# Patient Record
Sex: Female | Born: 1960 | Race: Black or African American | Hispanic: No | Smoking: Current every day smoker
Health system: Southern US, Community
[De-identification: ages and names within clinical notes are randomized; demographics above are authoritative.]

## PROBLEM LIST (undated history)

## (undated) DIAGNOSIS — R0609 Other forms of dyspnea: Secondary | ICD-10-CM

## (undated) DIAGNOSIS — R0600 Dyspnea, unspecified: Secondary | ICD-10-CM

## (undated) DIAGNOSIS — E079 Disorder of thyroid, unspecified: Secondary | ICD-10-CM

## (undated) DIAGNOSIS — I10 Essential (primary) hypertension: Secondary | ICD-10-CM

## (undated) HISTORY — DX: Dyspnea, unspecified: R06.00

## (undated) HISTORY — PX: SHOULDER SURGERY: SHX246

## (undated) HISTORY — PX: ABDOMINAL HYSTERECTOMY: SHX81

## (undated) HISTORY — DX: Other forms of dyspnea: R06.09

---

## 1998-12-30 HISTORY — PX: SHOULDER SURGERY: SHX246

## 2004-10-02 ENCOUNTER — Ambulatory Visit: Payer: Self-pay | Admitting: Obstetrics & Gynecology

## 2005-08-19 ENCOUNTER — Ambulatory Visit: Payer: Self-pay | Admitting: Unknown Physician Specialty

## 2006-01-08 ENCOUNTER — Ambulatory Visit: Payer: Self-pay | Admitting: Obstetrics & Gynecology

## 2007-01-22 ENCOUNTER — Ambulatory Visit: Payer: Self-pay | Admitting: Obstetrics & Gynecology

## 2008-01-13 ENCOUNTER — Ambulatory Visit: Payer: Self-pay | Admitting: Obstetrics & Gynecology

## 2010-04-19 ENCOUNTER — Ambulatory Visit: Payer: Self-pay | Admitting: Obstetrics & Gynecology

## 2011-01-11 ENCOUNTER — Emergency Department (HOSPITAL_BASED_OUTPATIENT_CLINIC_OR_DEPARTMENT_OTHER)
Admission: EM | Admit: 2011-01-11 | Discharge: 2011-01-11 | Payer: Self-pay | Source: Home / Self Care | Admitting: Emergency Medicine

## 2011-09-12 ENCOUNTER — Ambulatory Visit: Payer: Self-pay | Admitting: Obstetrics & Gynecology

## 2011-12-02 ENCOUNTER — Emergency Department (INDEPENDENT_AMBULATORY_CARE_PROVIDER_SITE_OTHER): Payer: Federal, State, Local not specified - PPO

## 2011-12-02 ENCOUNTER — Emergency Department (HOSPITAL_BASED_OUTPATIENT_CLINIC_OR_DEPARTMENT_OTHER)
Admission: EM | Admit: 2011-12-02 | Discharge: 2011-12-02 | Disposition: A | Discharge status: Home / Self Care | Payer: Federal, State, Local not specified - PPO | Attending: Emergency Medicine | Admitting: Emergency Medicine

## 2011-12-02 ENCOUNTER — Encounter: Payer: Self-pay | Admitting: *Deleted

## 2011-12-02 DIAGNOSIS — X503XXA Overexertion from repetitive movements, initial encounter: Secondary | ICD-10-CM | POA: Insufficient documentation

## 2011-12-02 DIAGNOSIS — S239XXA Sprain of unspecified parts of thorax, initial encounter: Secondary | ICD-10-CM | POA: Insufficient documentation

## 2011-12-02 DIAGNOSIS — Y9289 Other specified places as the place of occurrence of the external cause: Secondary | ICD-10-CM | POA: Insufficient documentation

## 2011-12-02 DIAGNOSIS — M25519 Pain in unspecified shoulder: Secondary | ICD-10-CM

## 2011-12-02 DIAGNOSIS — E079 Disorder of thyroid, unspecified: Secondary | ICD-10-CM | POA: Insufficient documentation

## 2011-12-02 DIAGNOSIS — F17200 Nicotine dependence, unspecified, uncomplicated: Secondary | ICD-10-CM | POA: Insufficient documentation

## 2011-12-02 DIAGNOSIS — M549 Dorsalgia, unspecified: Secondary | ICD-10-CM

## 2011-12-02 DIAGNOSIS — R079 Chest pain, unspecified: Secondary | ICD-10-CM

## 2011-12-02 HISTORY — DX: Disorder of thyroid, unspecified: E07.9

## 2011-12-02 MED ORDER — CYCLOBENZAPRINE HCL 10 MG PO TABS: 10.0000 mg | ORAL_TABLET | Freq: Two times a day (BID) | ORAL | Status: DC | PRN

## 2011-12-02 NOTE — ED Notes (Signed)
Right shoulder right side of mid to upper back pain onset 10 days ago but isnt getting better got much worse last pm

## 2011-12-02 NOTE — ED Provider Notes (Signed)
History     CSN: [id number] Arrival date & time: 12/02/2011  8:36 AM   First MD Initiated Contact with Patient 12/02/11 1010      Chief Complaint  Patient presents with  . Shoulder Pain    (Consider location/radiation/quality/duration/timing/severity/associated sxs/prior treatment) HPI Pain in right shoulder and right upper back for ten days.  Patient lifts constantly at work.  Pain increases with any movement.  No cough, fever, or dyspnea. Patient has taken ibuprofen with some relief.  PMD none.     Past Medical History  Diagnosis Date  . Thyroid disease     Past Surgical History  Procedure Date  . Abdominal hysterectomy   . Shoulder surgery     left reconstructive     History reviewed. No pertinent family history.  History  Substance Use Topics  . Smoking status: Current Everyday Smoker -- 0.5 packs/day  . Smokeless tobacco: Not on file  . Alcohol Use: Yes     occ    OB History    Grav Para Term Preterm Abortions TAB SAB Ect Mult Living                  Review of Systems  All other systems reviewed and are negative.    Allergies  Review of patient's allergies indicates no known allergies.  Home Medications   Current Outpatient Rx  Name Route Sig Dispense Refill  . LEVOTHYROXINE SODIUM 100 MCG PO TABS Oral Take 100 mcg by mouth daily.        BP 133/88  Pulse 91  Temp(Src) 98.5 F (36.9 C) (Oral)  Resp 18  Ht 5' 7.5" (1.715 m)  Wt 195 lb (88.451 kg)  BMI 30.09 kg/m2  SpO2 98%  Physical Exam  Nursing note and vitals reviewed. Constitutional: She is oriented to person, place, and time. She appears well-developed and well-nourished.  HENT:  Head: Normocephalic and atraumatic.  Cardiovascular: Normal rate, regular rhythm, normal heart sounds and intact distal pulses.   Pulmonary/Chest: Effort normal and breath sounds normal.  Abdominal: Soft. Bowel sounds are normal.  Musculoskeletal: Normal range of motion.       Right paraspinal tenderness   Neurological: She is alert and oriented to person, place, and time. She has normal reflexes.  Skin: Skin is warm and dry.  Psychiatric: She has a normal mood and affect.    ED Course  Procedures (including critical care time)  Labs Reviewed - No data to display Dg Chest 2 View  12/02/2011  *RADIOLOGY REPORT*  Clinical Data: Right shoulder and upper back pain.  CHEST - 2 VIEW  Comparison: None.  Findings: The lungs are clear.  The heart and mediastinal structures are normal.  The osseous structures are unremarkable.  IMPRESSION: No evidence for active chest disease.  Original Report Authenticated By: Rolla Plate, M.D.   Dg Shoulder Right  12/02/2011  *RADIOLOGY REPORT*  Clinical Data: Right shoulder pain.  RIGHT SHOULDER - 2+ VIEW  Comparison: None.  Findings: There are no erosive or destructive changes.  There is no evidence for an occult fracture.  The soft tissues have a normal appearance.  IMPRESSION: Negative study.  Original Report Authenticated By: Rolla Plate, M.D.     No diagnosis found.    MDM       Hilario Quarry, MD 12/02/11 [phone]

## 2011-12-12 ENCOUNTER — Ambulatory Visit (INDEPENDENT_AMBULATORY_CARE_PROVIDER_SITE_OTHER): Payer: Federal, State, Local not specified - PPO | Admitting: Family Medicine

## 2011-12-12 ENCOUNTER — Encounter: Payer: Self-pay | Admitting: Family Medicine

## 2011-12-12 DIAGNOSIS — M7540 Impingement syndrome of unspecified shoulder: Secondary | ICD-10-CM

## 2011-12-12 DIAGNOSIS — S239XXA Sprain of unspecified parts of thorax, initial encounter: Secondary | ICD-10-CM

## 2011-12-12 DIAGNOSIS — S29019A Strain of muscle and tendon of unspecified wall of thorax, initial encounter: Secondary | ICD-10-CM | POA: Insufficient documentation

## 2011-12-12 HISTORY — DX: Impingement syndrome of unspecified shoulder: M75.40

## 2011-12-12 MED ORDER — TRAMADOL HCL 50 MG PO TABS: 50.0000 mg | ORAL_TABLET | Freq: Four times a day (QID) | ORAL | Status: AC | PRN

## 2011-12-12 MED ORDER — CYCLOBENZAPRINE HCL 10 MG PO TABS: 10.0000 mg | ORAL_TABLET | Freq: Three times a day (TID) | ORAL | Status: AC | PRN

## 2011-12-12 MED ORDER — MELOXICAM 15 MG PO TABS: 15.0000 mg | ORAL_TABLET | Freq: Every day | ORAL | Status: DC

## 2011-12-12 NOTE — Assessment & Plan Note (Signed)
Exam mostly normal and states pain is only occasional, not hurting her currently.  Describes rotator cuff impingement.  Start meloxicam and PT for this as well.  Icing as needed, avoid painful activities.  See instructions for further.

## 2011-12-12 NOTE — Patient Instructions (Signed)
You have a thoracic muscle strain of your back. Take tylenol for baseline pain relief (1-2 extra strength tabs 3x/day) Meloxicam daily with food for pain and inflammation. Tramadol as needed for severe pain (no driving on this medicine). Flexeril as needed for muscle spasms (no driving on this medicine if it makes you sleepy). Stay as active as possible. Start physical therapy for this but also do home exercises and stretches as directed. Consider massage, chiropractor, physical therapy, and/or acupuncture. Physical therapy has been shown to be helpful while the others have mixed results. Strengthening of low back muscles, abdominal musculature are key for [place] term pain relief.  You have rotator cuff impingement Try to avoid painful activities (overhead activities, lifting with extended arm) as much as possible. Meloxicam, tylenol, tramadol for pain as noted above. Subacromial injection may be beneficial to help with pain and to decrease inflammation if this becomes constant. Start physical therapy and home exercises for this as well. If not improving at follow-up we will consider ultrasound.  Follow up with me in 1 month.

## 2011-12-12 NOTE — Assessment & Plan Note (Signed)
Thoracic strain - Do not think dedicated thoracic radiographs are warranted at this time.  Start PT, meloxicam with flexeril and tramadol as needed.  Ice or heat as needed.

## 2011-12-12 NOTE — Progress Notes (Signed)
Subjective:    Patient ID: Kelly Frank, female    DOB: [birth year], 50 y.o.   MRN: [id number]  PCP: None listed  HPI 50 yo F here for mid-back and right shoulder pain.  Patient denies known injury. 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 About 2-3 weeks ago developed worsening right thoracic back pain. No numbness or tingling. Went to ED because pain was so severe in back - given flexeril, had normal CXR and right shoulder radiographs. Did wake her at night (back). No radiation into extremities. No bowel/bladder dysfunction.  She is right handed. Gets a sharp occasional pain in right shoulder when in certain positions (mostly lifting and reaching behind). Feels like it's going to pop out of socket at times though has not had a prior injury. Does not wake her at night. Prior left shoulder dislocations and surgery for this.  Past Medical History  Diagnosis Date  . Thyroid disease     Current Outpatient Prescriptions on File Prior to Visit  Medication Sig Dispense Refill  . levothyroxine (SYNTHROID, LEVOTHROID) 100 MCG tablet Take 100 mcg by mouth daily.          Past Surgical History  Procedure Date  . Abdominal hysterectomy   . Shoulder surgery     left reconstructive     No Known Allergies  History   Social History  . Marital Status: Divorced    Spouse Name: N/A    Number of Children: N/A  . Years of Education: N/A   Occupational History  . Not on file.   Social History Main Topics  . Smoking status: Current Everyday Smoker -- 0.5 packs/day  . Smokeless tobacco: Not on file  . Alcohol Use: Yes     occ  . Drug Use: No  . Sexually Active: Yes    Birth Control/ Protection: Surgical   Other Topics Concern  . Not on file   Social History Narrative  . No narrative on file    Family History  Problem Relation Age of Onset  . Diabetes Mother   . Heart attack Neg Hx   . Hyperlipidemia Neg Hx   . Hypertension  Neg Hx   . Sudden death Neg Hx     BP 103/70  Pulse 102  Temp(Src) 97.6 F (36.4 C) (Oral)  Ht 5\' 7"  (1.702 m)  Wt 195 lb (88.451 kg)  BMI 30.54 kg/m2  Review of Systems See HPI above.    Objective:   Physical Exam Gen: NAD  Back: No gross deformity, scoliosis. TTP R thoracic paraspinal region in musculature.  No midline or bony TTP. FROM with pain on extension, rotation. No scapular winging. Sensation intact to light touch bilaterally. Negative logroll bilateral hips  R shoulder: No swelling, ecchymoses.  No gross deformity. Minimal TTP at biceps tendon, lateral shoulder.  No AC TTP. FROM without painful arc. Negative Hawkins, Neers. Negative Speeds, Yergasons. Negative Empty can and resisted internal/external rotation with 5/5 strength. Negative apprehension. NV intact distally.    Assessment & Plan:  1. Thoracic strain - Do not think dedicated thoracic radiographs are warranted at this time.  Start PT, meloxicam with flexeril and tramadol as needed.  Ice or heat as needed.    2. Right shoulder pain - Exam mostly normal and states pain is only occasional, not hurting her currently.  Describes rotator cuff impingement.  Start meloxicam and PT for this as well.  Icing as needed, avoid  painful activities.  See instructions for further.

## 2011-12-17 ENCOUNTER — Ambulatory Visit: Payer: Federal, State, Local not specified - PPO | Attending: Family Medicine | Admitting: Physical Therapy

## 2011-12-17 DIAGNOSIS — M546 Pain in thoracic spine: Secondary | ICD-10-CM | POA: Insufficient documentation

## 2011-12-17 DIAGNOSIS — M25519 Pain in unspecified shoulder: Secondary | ICD-10-CM | POA: Insufficient documentation

## 2011-12-17 DIAGNOSIS — IMO0001 Reserved for inherently not codable concepts without codable children: Secondary | ICD-10-CM | POA: Insufficient documentation

## 2012-01-02 ENCOUNTER — Encounter: Payer: Federal, State, Local not specified - PPO | Admitting: Physical Therapy

## 2012-01-06 ENCOUNTER — Ambulatory Visit: Payer: Federal, State, Local not specified - PPO | Attending: Family Medicine | Admitting: Physical Therapy

## 2012-01-06 DIAGNOSIS — IMO0001 Reserved for inherently not codable concepts without codable children: Secondary | ICD-10-CM | POA: Insufficient documentation

## 2012-01-06 DIAGNOSIS — M546 Pain in thoracic spine: Secondary | ICD-10-CM | POA: Insufficient documentation

## 2012-01-06 DIAGNOSIS — M25519 Pain in unspecified shoulder: Secondary | ICD-10-CM | POA: Insufficient documentation

## 2012-01-08 ENCOUNTER — Ambulatory Visit: Payer: Federal, State, Local not specified - PPO | Admitting: Physical Therapy

## 2012-01-13 ENCOUNTER — Ambulatory Visit: Payer: Federal, State, Local not specified - PPO | Admitting: Physical Therapy

## 2012-01-27 ENCOUNTER — Ambulatory Visit: Payer: Federal, State, Local not specified - PPO | Admitting: Physical Therapy

## 2012-02-05 ENCOUNTER — Other Ambulatory Visit: Payer: Self-pay | Admitting: Family Medicine

## 2013-10-22 ENCOUNTER — Encounter: Payer: Self-pay | Admitting: *Deleted

## 2013-11-09 ENCOUNTER — Ambulatory Visit (INDEPENDENT_AMBULATORY_CARE_PROVIDER_SITE_OTHER): Payer: BC Managed Care – PPO | Admitting: General Surgery

## 2013-11-09 ENCOUNTER — Encounter: Payer: Self-pay | Admitting: General Surgery

## 2013-11-09 VITALS — BP 124/66 | HR 78 | Resp 12 | Ht 67.0 in | Wt 203.0 lb

## 2013-11-09 DIAGNOSIS — Z1211 Encounter for screening for malignant neoplasm of colon: Secondary | ICD-10-CM | POA: Insufficient documentation

## 2013-11-09 MED ORDER — POLYETHYLENE GLYCOL 3350 17 GM/SCOOP PO POWD: 1.0000 | Freq: Once | ORAL | Status: DC

## 2013-11-09 NOTE — Patient Instructions (Addendum)
The patient is aware to call back for any questions or concerns. Colonoscopy A colonoscopy is an exam to evaluate your entire colon. In this exam, your colon is cleansed. A Thorup fiberoptic tube is inserted through your rectum and into your colon. The fiberoptic scope (endoscope) is a Beckner bundle of enclosed and very flexible fibers. These fibers transmit light to the area examined and send images from that area to your caregiver. Discomfort is usually minimal. You may be given a drug to help you sleep (sedative) during or prior to the procedure. This exam helps to detect lumps (tumors), polyps, inflammation, and areas of bleeding. Your caregiver may also take a small piece of tissue (biopsy) that will be examined under a microscope. LET YOUR CAREGIVER KNOW ABOUT:   Allergies to food or medicine.  Medicines taken, including vitamins, herbs, eyedrops, over-the-counter medicines, and creams.  Use of steroids (by mouth or creams).  Previous problems with anesthetics or numbing medicines.  History of bleeding problems or blood clots.  Previous surgery.  Other health problems, including diabetes and kidney problems.  Possibility of pregnancy, if this applies. BEFORE THE PROCEDURE   A clear liquid diet may be required for 2 days before the exam.  Ask your caregiver about changing or stopping your regular medications.  Liquid injections (enemas) or laxatives may be required.  A large amount of electrolyte solution may be given to you to drink over a short period of time. This solution is used to clean out your colon.  You should be present 60 minutes prior to your procedure or as directed by your caregiver. AFTER THE PROCEDURE   If you received a sedative or pain relieving medication, you will need to arrange for someone to drive you home.  Occasionally, there is a little blood passed with the first bowel movement. Do not be concerned. FINDING OUT THE RESULTS OF YOUR TEST Not all test  results are available during your visit. If your test results are not back during the visit, make an appointment with your caregiver to find out the results. Do not assume everything is normal if you have not heard from your caregiver or the medical facility. It is important for you to follow up on all of your test results. HOME CARE INSTRUCTIONS   It is not unusual to pass moderate amounts of gas and experience mild abdominal cramping following the procedure. This is due to air being used to inflate your colon during the exam. Walking or a warm pack on your belly (abdomen) may help.  You may resume all normal meals and activities after sedatives and medicines have worn off.  Only take over-the-counter or prescription medicines for pain, discomfort, or fever as directed by your caregiver. Do not use aspirin or blood thinners if a biopsy was taken. Consult your caregiver for medicine usage if biopsies were taken. SEEK IMMEDIATE MEDICAL CARE IF:   You have a fever.  You pass large blood clots or fill a toilet with blood following the procedure. This may also occur 10 to 14 days following the procedure. This is more likely if a biopsy was taken.  You develop abdominal pain that keeps getting worse and cannot be relieved with medicine. Document Released: 12/13/2000 Document Revised: 03/09/2012 Document Reviewed: 07/19/2013 [facility] Patient Information 2014 [place], [place].   Patient is scheduled for a colonoscopy at [facility] on 12/14/13, date and instructions provided to the patient. Mira lax prescription E-Prescribed. patient aware to call us with any questions or changes  in medications or health status.

## 2013-11-09 NOTE — Progress Notes (Signed)
Patient ID: Kelly Frank, female   DOB: 09/29/1961, 52 y.o.   MRN: [id number]  Chief Complaint  Patient presents with  . Other    colonoscpy    HPI Kelly Frank is a 52 y.o. female.  Here today for evaluation of a screening colonoscopy referred by Dr. Velora Mediate. She has never had a colonoscopy.  She denies any gastrointestinal issues. She states her bowels normally move about 3 times a day. Denies any colon cancer or polyps in the family. HPI  Past Medical History  Diagnosis Date  . Thyroid disease     Past Surgical History  Procedure Laterality Date  . Abdominal hysterectomy    . Shoulder surgery  2000    left reconstructive     Family History  Problem Relation Age of Onset  . Diabetes Mother   . Heart attack Neg Hx   . Hyperlipidemia Neg Hx   . Hypertension Neg Hx   . Sudden death Neg Hx     Social History History  Substance Use Topics  . Smoking status: Current Every Day Smoker -- 0.50 packs/day for 25 years  . Smokeless tobacco: Not on file  . Alcohol Use: Yes     Comment: occasionally    No Known Allergies  Current Outpatient Prescriptions  Medication Sig Dispense Refill  . levothyroxine (SYNTHROID, LEVOTHROID) 100 MCG tablet Take 100 mcg by mouth daily.        . meloxicam (MOBIC) 15 MG tablet TAKE 1 TABLET BY MOUTH ONCE DAILY WITH FOOD  30 tablet  1  . polyethylene glycol powder (GLYCOLAX/MIRALAX) powder Take 255 g (1 Container total) by mouth once.  255 g  0   No current facility-administered medications for this visit.    Review of Systems Review of Systems  Constitutional: Negative.   Respiratory: Negative.   Cardiovascular: Negative.   Gastrointestinal: Negative.     Blood pressure 124/66, pulse 78, resp. rate 12, height 5\' 7"  (1.702 m), weight 203 lb (92.08 kg).  Physical Exam Physical Exam  Constitutional: She is oriented to person, place, and time. She appears well-developed and well-nourished.  Cardiovascular: Normal rate, regular rhythm  and normal heart sounds.   Pulmonary/Chest: Effort normal and breath sounds normal.  Neurological: She is alert and oriented to person, place, and time.  Skin: Skin is warm and dry.    Data Reviewed PCP Notes.  Assessment    Candidate for screening colonoscopy.     Plan    Colonoscopy with possible biopsy/polypectomy prn: Information regarding the procedure, including its potential risks and complications (including but not limited to perforation of the bowel, which may require emergency surgery to repair, and bleeding) was verbally given to the patient. Educational information regarding lower instestinal endoscopy was given to the patient. Written instructions for how to complete the bowel prep using Miralax were provided. The importance of drinking ample fluids to avoid dehydration as a result of the prep emphasized.    Patient is scheduled for a colonoscopy at [facility] on 12/14/13, date and instructions provided to the patient. Mira lax prescription E-Prescribed. patient aware to call us with any questions or changes in medications or health status.    Kelly Frank 11/09/2013, 8:21 PM

## 2013-12-08 ENCOUNTER — Telehealth: Payer: Self-pay | Admitting: *Deleted

## 2013-12-08 NOTE — Telephone Encounter (Signed)
Patient confirms no medication changes since last office visit. This patient also reports she has not pre-registered and was instructed to do that by Thursday, 12-09-13. She reports she already has Miralax prescription. Patient instructed to call the office if she has further questions.

## 2013-12-09 ENCOUNTER — Other Ambulatory Visit: Payer: Self-pay | Admitting: General Surgery

## 2013-12-09 DIAGNOSIS — Z1211 Encounter for screening for malignant neoplasm of colon: Secondary | ICD-10-CM

## 2013-12-14 ENCOUNTER — Ambulatory Visit: Payer: Self-pay | Admitting: General Surgery

## 2013-12-14 DIAGNOSIS — Z1211 Encounter for screening for malignant neoplasm of colon: Secondary | ICD-10-CM

## 2013-12-16 ENCOUNTER — Encounter: Payer: Self-pay | Admitting: General Surgery

## 2014-10-31 ENCOUNTER — Encounter: Payer: Self-pay | Admitting: General Surgery

## 2016-05-14 ENCOUNTER — Other Ambulatory Visit: Payer: Self-pay | Admitting: Obstetrics & Gynecology

## 2016-05-14 DIAGNOSIS — Z1231 Encounter for screening mammogram for malignant neoplasm of breast: Secondary | ICD-10-CM

## 2017-03-26 ENCOUNTER — Telehealth: Payer: Self-pay | Admitting: Internal Medicine

## 2017-03-26 NOTE — Telephone Encounter (Signed)
[facility] for appointment on 04/03/17 with Dr Rennis GoldenHilty.  Records put with Dr Blanchie DessertHilty's schedule for 04/03/17. lp

## 2017-04-03 ENCOUNTER — Ambulatory Visit: Payer: Federal, State, Local not specified - PPO | Admitting: Internal Medicine

## 2017-04-18 ENCOUNTER — Encounter (HOSPITAL_BASED_OUTPATIENT_CLINIC_OR_DEPARTMENT_OTHER): Payer: Self-pay | Admitting: *Deleted

## 2017-04-18 DIAGNOSIS — F17200 Nicotine dependence, unspecified, uncomplicated: Secondary | ICD-10-CM | POA: Diagnosis not present

## 2017-04-18 DIAGNOSIS — I10 Essential (primary) hypertension: Secondary | ICD-10-CM | POA: Insufficient documentation

## 2017-04-18 DIAGNOSIS — Z79899 Other long term (current) drug therapy: Secondary | ICD-10-CM | POA: Diagnosis not present

## 2017-04-18 DIAGNOSIS — R1013 Epigastric pain: Secondary | ICD-10-CM | POA: Diagnosis not present

## 2017-04-18 NOTE — ED Triage Notes (Signed)
Pt with periumbilical pain x one week. Denies N/V D or fevers. Was seen by PCP on Monday was given a medication but is not certain what medication is , may be for gas or acid

## 2017-04-19 ENCOUNTER — Emergency Department (HOSPITAL_BASED_OUTPATIENT_CLINIC_OR_DEPARTMENT_OTHER)
Admission: EM | Admit: 2017-04-19 | Discharge: 2017-04-19 | Disposition: A | Discharge status: Home / Self Care | Payer: Federal, State, Local not specified - PPO | Attending: Emergency Medicine | Admitting: Emergency Medicine

## 2017-04-19 DIAGNOSIS — R1013 Epigastric pain: Secondary | ICD-10-CM

## 2017-04-19 HISTORY — DX: Essential (primary) hypertension: I10

## 2017-04-19 LAB — COMPREHENSIVE METABOLIC PANEL
ALBUMIN: 3.1 g/dL — AB (ref 3.5–5.0)
ALK PHOS: 91 U/L (ref 38–126)
ALT: 27 U/L (ref 14–54)
AST: 28 U/L (ref 15–41)
Anion gap: 9 (ref 5–15)
BUN: 15 mg/dL (ref 6–20)
CALCIUM: 8.7 mg/dL — AB (ref 8.9–10.3)
CO2: 28 mmol/L (ref 22–32)
Chloride: 102 mmol/L (ref 101–111)
Creatinine, Ser: 1.26 mg/dL — ABNORMAL HIGH (ref 0.44–1.00)
GFR calc Af Amer: 55 mL/min — ABNORMAL LOW (ref >60)
GFR calc non Af Amer: 47 mL/min — ABNORMAL LOW (ref >60)
GLUCOSE: 96 mg/dL (ref 65–99)
Potassium: 3.9 mmol/L (ref 3.5–5.1)
Sodium: 139 mmol/L (ref 135–145)
TOTAL PROTEIN: 6 g/dL — AB (ref 6.5–8.1)
Total Bilirubin: 1.3 mg/dL — ABNORMAL HIGH (ref 0.3–1.2)

## 2017-04-19 LAB — URINALYSIS, ROUTINE W REFLEX MICROSCOPIC
Glucose, UA: NEGATIVE mg/dL
Hgb urine dipstick: NEGATIVE
Ketones, ur: 15 mg/dL — AB
NITRITE: NEGATIVE
PH: 5.5 (ref 5.0–8.0)
PROTEIN: 30 mg/dL — AB
Specific Gravity, Urine: 1.028 (ref 1.005–1.030)

## 2017-04-19 LAB — CBC WITH DIFFERENTIAL/PLATELET
BASOS ABS: 0 10*3/uL (ref 0.0–0.1)
BASOS PCT: 0 %
Eosinophils Absolute: 0.2 10*3/uL (ref 0.0–0.7)
Eosinophils Relative: 4 %
HEMATOCRIT: 45.3 % (ref 36.0–46.0)
HEMOGLOBIN: 15.3 g/dL — AB (ref 12.0–15.0)
Lymphocytes Relative: 29 %
Lymphs Abs: 1.9 10*3/uL (ref 0.7–4.0)
MCH: 28.9 pg (ref 26.0–34.0)
MCHC: 33.8 g/dL (ref 30.0–36.0)
MCV: 85.5 fL (ref 78.0–100.0)
MONOS PCT: 10 %
Monocytes Absolute: 0.7 10*3/uL (ref 0.1–1.0)
NEUTROS ABS: 3.6 10*3/uL (ref 1.7–7.7)
NEUTROS PCT: 57 %
Platelets: 204 10*3/uL (ref 150–400)
RBC: 5.3 MIL/uL — AB (ref 3.87–5.11)
RDW: 15.7 % — ABNORMAL HIGH (ref 11.5–15.5)
WBC: 6.3 10*3/uL (ref 4.0–10.5)

## 2017-04-19 LAB — URINALYSIS, MICROSCOPIC (REFLEX)

## 2017-04-19 LAB — LIPASE, BLOOD: Lipase: 61 U/L — ABNORMAL HIGH (ref 11–51)

## 2017-04-19 MED ORDER — SUCRALFATE 1 G PO TABS: 1.0000 g | ORAL_TABLET | Freq: Three times a day (TID) | ORAL | 0 refills | Status: DC

## 2017-04-19 MED ORDER — SUCRALFATE 1 G PO TABS
1.0000 g | ORAL_TABLET | Freq: Once | ORAL | Status: AC
  Administered 2017-04-19: 1 g via ORAL
  Filled 2017-04-19: qty 1

## 2017-04-19 NOTE — ED Notes (Signed)
Pt given cup to attempt urine sample. 

## 2017-04-19 NOTE — ED Provider Notes (Signed)
[facility] Provider Note: Lowella Dell, MD, FACEP  CSN: [id number] MRN: [id number] ARRIVAL: 04/18/17 at 2334 ROOM: MH06/MH06   CHIEF COMPLAINT  Abdominal Pain   HISTORY OF PRESENT ILLNESS  Kelly Frank is a 56 y.o. female with a one-week history of epigastric pain. She describes the pain as a tightness as if she had pulled a muscle, but the pain is deep and not in the abdominal wall. She describes the pain is intermittent and worse when she ambulates. It does not change with eating or drinking. She rates it as a 7 out of 10 at its worst. She saw her PCP 5 days ago and was placed on "some little pill" that has not provided any relief. Her pain in fact has worsened since then. She denies associated chest pain, shortness of breath, nausea, vomiting, diarrhea or dysuria.   Past Medical History:  Diagnosis Date  . DOE (dyspnea on exertion)   . Hypertension   . Thyroid disease     Past Surgical History:  Procedure Laterality Date  . ABDOMINAL HYSTERECTOMY    . SHOULDER SURGERY  2000   left reconstructive   . SHOULDER SURGERY Left     Family History  Problem Relation Age of Onset  . Diabetes Mother   . Hypertension Mother   . Heart attack Neg Hx   . Hyperlipidemia Neg Hx   . Sudden death Neg Hx     Social History  Substance Use Topics  . Smoking status: Current Every Day Smoker    Packs/day: 0.50    Years: 25.00  . Smokeless tobacco: Never Used  . Alcohol use Yes     Comment: occasionally    Prior to Admission medications   Medication Sig Start Date End Date Taking? Authorizing Provider  baclofen (LIORESAL) 10 MG tablet Take 10 mg by mouth 3 (three) times daily.    Historical Provider, MD  carvedilol (COREG) 3.125 MG tablet Take 1 tablet by mouth 2 (two) times daily. 03/12/17   Historical Provider, MD  furosemide (LASIX) 20 MG tablet Take 20 mg by mouth daily. 03/12/17   Historical Provider, MD  levothyroxine (SYNTHROID, LEVOTHROID) 200 MCG tablet Take  200 mcg by mouth daily. 03/20/17   Historical Provider, MD  lisinopril (PRINIVIL,ZESTRIL) 10 MG tablet Take 10 mg by mouth daily. 03/12/17   Historical Provider, MD    Allergies Patient has no known allergies.   REVIEW OF SYSTEMS  Negative except as noted here or in the History of Present Illness.   PHYSICAL EXAMINATION  Initial Vital Signs Blood pressure (!) 132/99, pulse 88, temperature 98.3 F (36.8 C), temperature source Oral, resp. rate 14, height  (1.702 m), weight 174 lb (78.9 kg), SpO2 97 %.  Examination General: Well-developed, well-nourished female in no acute distress; appearance consistent with age of record HENT: normocephalic; atraumatic Eyes: pupils equal, round and reactive to light; extraocular muscles intact Neck: supple Heart: regular rate and rhythm Lungs: clear to auscultation bilaterally Abdomen: soft; nondistended; epigastric tenderness; no masses or hepatosplenomegaly; bowel sounds present Extremities: No deformity; full range of motion; pulses normal Neurologic: Awake, alert and oriented; motor function intact in all extremities and symmetric; no facial droop Skin: Warm and dry Psychiatric: Normal mood and affect   RESULTS  Summary of this visit's results, reviewed by myself:   EKG Interpretation  Date/Time:    Ventricular Rate:    PR Interval:    QRS Duration:   QT Interval:    QTC  Calculation:   R Axis:     Text Interpretation:        Laboratory Studies: Results for orders placed or performed during the hospital encounter of 04/19/17 (from the past 24 hour(s))  Urinalysis, Routine w reflex microscopic     Status: Abnormal   Collection Time: 04/19/17  2:01 AM  Result Value Ref Range   Color, Urine AMBER (A) YELLOW   APPearance CLEAR CLEAR   Specific Gravity, Urine 1.028 1.005 - 1.030   pH 5.5 5.0 - 8.0   Glucose, UA NEGATIVE NEGATIVE mg/dL   Hgb urine dipstick NEGATIVE NEGATIVE   Bilirubin Urine SMALL (A) NEGATIVE   Ketones, ur  15 (A) NEGATIVE mg/dL   Protein, ur 30 (A) NEGATIVE mg/dL   Nitrite NEGATIVE NEGATIVE   Leukocytes, UA SMALL (A) NEGATIVE  Urinalysis, Microscopic (reflex)     Status: Abnormal   Collection Time: 04/19/17  2:01 AM  Result Value Ref Range   RBC / HPF 0-5 0 - 5 RBC/hpf   WBC, UA 6-30 0 - 5 WBC/hpf   Bacteria, UA MANY (A) NONE SEEN   Squamous Epithelial / LPF 0-5 (A) NONE SEEN   Mucous PRESENT   CBC with Differential     Status: Abnormal   Collection Time: 04/19/17  2:15 AM  Result Value Ref Range   WBC 6.3 4.0 - 10.5 K/uL   RBC 5.30 (H) 3.87 - 5.11 MIL/uL   Hemoglobin 15.3 (H) 12.0 - 15.0 g/dL   HCT 16.1 09.6 - 04.5 %   MCV 85.5 78.0 - 100.0 fL   MCH 28.9 26.0 - 34.0 pg   MCHC 33.8 30.0 - 36.0 g/dL   RDW 40.9 (H) 81.1 - 91.4 %   Platelets 204 150 - 400 K/uL   Neutrophils Relative % 57 %   Neutro Abs 3.6 1.7 - 7.7 K/uL   Lymphocytes Relative 29 %   Lymphs Abs 1.9 0.7 - 4.0 K/uL   Monocytes Relative 10 %   Monocytes Absolute 0.7 0.1 - 1.0 K/uL   Eosinophils Relative 4 %   Eosinophils Absolute 0.2 0.0 - 0.7 K/uL   Basophils Relative 0 %   Basophils Absolute 0.0 0.0 - 0.1 K/uL  Comprehensive metabolic panel     Status: Abnormal   Collection Time: 04/19/17  2:15 AM  Result Value Ref Range   Sodium 139 135 - 145 mmol/L   Potassium 3.9 3.5 - 5.1 mmol/L   Chloride 102 101 - 111 mmol/L   CO2 28 22 - 32 mmol/L   Glucose, Bld 96 65 - 99 mg/dL   BUN 15 6 - 20 mg/dL   Creatinine, Ser 7.82 (H) 0.44 - 1.00 mg/dL   Calcium 8.7 (L) 8.9 - 10.3 mg/dL   Total Protein 6.0 (L) 6.5 - 8.1 g/dL   Albumin 3.1 (L) 3.5 - 5.0 g/dL   AST 28 15 - 41 U/L   ALT 27 14 - 54 U/L   Alkaline Phosphatase 91 38 - 126 U/L   Total Bilirubin 1.3 (H) 0.3 - 1.2 mg/dL   GFR calc non Af Amer 47 (L) >60 mL/min   GFR calc Af Amer 55 (L) >60 mL/min   Anion gap 9 5 - 15  Lipase, blood     Status: Abnormal   Collection Time: 04/19/17  2:15 AM  Result Value Ref Range   Lipase 61 (H) 11 - 51 U/L   Imaging  Studies: No results found.  ED COURSE  Nursing notes and initial  vitals signs, including pulse oximetry, reviewed.  Vitals:   04/18/17 2348 04/18/17 2349 04/19/17 0205  BP:  (!) 112/93 (!) 132/99  Pulse: 92  88  Resp: 16  14  Temp: 98.3 F (36.8 C)    TempSrc: Oral    SpO2: 100%  97%  Weight: 174 lb (78.9 kg)    Height:  (1.702 m)     4:12 AM Improvement after oral Carafate. Patient advised slightly elevated lipase but I do not believe a CT scan is indicated at this time. We will treat with Carafate and have her follow-up with her PCP should symptoms worsen or persist.  PROCEDURES    ED DIAGNOSES     ICD-9-CM ICD-10-CM   1. Epigastric pain 789.06 R10.13        Paula Libra, MD 04/19/17 [phone]

## 2017-04-29 ENCOUNTER — Encounter: Payer: Self-pay | Admitting: Internal Medicine

## 2017-04-29 ENCOUNTER — Ambulatory Visit (INDEPENDENT_AMBULATORY_CARE_PROVIDER_SITE_OTHER): Payer: Federal, State, Local not specified - PPO | Admitting: Internal Medicine

## 2017-04-29 VITALS — BP 119/88 | HR 92 | Ht 67.0 in | Wt 172.0 lb

## 2017-04-29 DIAGNOSIS — I10 Essential (primary) hypertension: Secondary | ICD-10-CM | POA: Diagnosis not present

## 2017-04-29 DIAGNOSIS — I5043 Acute on chronic combined systolic (congestive) and diastolic (congestive) heart failure: Secondary | ICD-10-CM

## 2017-04-29 DIAGNOSIS — Z720 Tobacco use: Secondary | ICD-10-CM | POA: Diagnosis not present

## 2017-04-29 HISTORY — DX: Tobacco use: Z72.0

## 2017-04-29 HISTORY — DX: Essential (primary) hypertension: I10

## 2017-04-29 NOTE — Patient Instructions (Signed)
We will request records from [facility].  Dr. Rennis Golden will review these records and we will call you with his plan based on the info received

## 2017-04-29 NOTE — Progress Notes (Signed)
OFFICE CONSULT NOTE  Chief Complaint:  Shortness of breath, fatigue  Primary Care Physician: [facility]  HPI:  Kelly Frank is a 56 y.o. female who is being seen today for the evaluation of new onset heart failure at the request of Lollie Marrow, MD. [name] was recently seen at [facility] for shortness of breath in March 2018. She described it as fairly sudden onset after respiratory infection. She says her symptoms worsened despite recovering from a respiratory infection and did include some fever, leg pain and swelling. She is a smoker with past medical history significant for dyslipidemia and family history of heart failure in her mother however at older age of onset. Today she reports shortness of breath when walking several blocks. She says she tried to mow her lawn and was only able to make about 8 passes before she became short of breath. She denies any chest pain. According to records from [facility] she had an echocardiogram in the office. I personally reviewed the records but was not provided the images to review of her echo, however records suggest the EF was 20% with marked 4 chamber dilatation, moderately depressed RV systolic function, moderate MR and TR and moderate to severe pulmonary hypertension. In addition troponin was ordered and was abnormal and a stress test was performed as well. We will try to obtain those results. She was started on carvedilol, it ACE inhibitor, aspirin and Lasix.  PMHx:  Past Medical History:  Diagnosis Date  . DOE (dyspnea on exertion)   . Hypertension   . Thyroid disease     Past Surgical History:  Procedure Laterality Date  . ABDOMINAL HYSTERECTOMY    . SHOULDER SURGERY  2000   left reconstructive   . SHOULDER SURGERY Left     FAMHx:  Family History  Problem Relation Age of Onset  . Diabetes Mother   . Hypertension Mother   . Heart attack Neg Hx   . Hyperlipidemia Neg Hx   . Sudden death Neg  Hx     SOCHx:   reports that she has been smoking.  She has a 12.50 pack-year smoking history. She has never used smokeless tobacco. She reports that she drinks alcohol. She reports that she does not use drugs.  ALLERGIES:  No Known Allergies  ROS: Pertinent items noted in HPI and remainder of comprehensive ROS otherwise negative.  HOME MEDS: Current Outpatient Prescriptions on File Prior to Visit  Medication Sig Dispense Refill  . baclofen (LIORESAL) 10 MG tablet Take 10 mg by mouth 3 (three) times daily.    . carvedilol (COREG) 3.125 MG tablet Take 1 tablet by mouth 2 (two) times daily.  3  . furosemide (LASIX) 20 MG tablet Take 20 mg by mouth daily.  0  . levothyroxine (SYNTHROID, LEVOTHROID) 200 MCG tablet Take 200 mcg by mouth daily.  2  . lisinopril (PRINIVIL,ZESTRIL) 10 MG tablet Take 10 mg by mouth daily.  3  . sucralfate (CARAFATE) 1 g tablet Take 1 tablet (1 g total) by mouth 4 (four) times daily -  with meals and at bedtime. 40 tablet 0   No current facility-administered medications on file prior to visit.     LABS/IMAGING: No results found for this or any previous visit (from the past 48 hour(s)). No results found.  LIPID PANEL: No results found for: CHOL, TRIG, HDL, CHOLHDL, VLDL, LDLCALC, LDLDIRECT  WEIGHTS: Wt Readings from Last 3 Encounters:  04/29/17 172 lb (78  kg)  04/18/17 174 lb (78.9 kg)  11/09/13 203 lb (92.1 kg)    VITALS: BP 119/88   Pulse 92   Ht  (1.702 m)   Wt 172 lb (78 kg)   BMI 26.94 kg/m   EXAM: General appearance: alert and no distress Neck: no carotid bruit and no JVD Lungs: clear to auscultation bilaterally Heart: regular rate and rhythm, S1, S2 normal, no S3 or S4 and systolic murmur: early systolic 2/6, blowing at apex Abdomen: soft, non-tender; bowel sounds normal; no masses,  no organomegaly Extremities: extremities normal, atraumatic, no cyanosis or edema Pulses: 2+ and symmetric Skin: Skin color, texture, turgor  normal. No rashes or lesions Neurologic: Grossly normal Psych: Pleasant  EKG: Normal sinus rhythm at 92, T-wave abnormalities laterally concerning for possible ischemia - personally reviewed  ASSESSMENT: 1. 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 Dyspnea on exertion-NYHA class III symptoms 3. Tobacco abuse 4. Hypertension  PLAN: 1.   Mrs. Debois had recent upper respiratory infection which may have preceded the development of worsening shortness of breath and was found to have new dilated cardiopathy with EF 20%. She apparently underwent nuclear stress testing at [facility] and those results are not available for me to review. We'll request the images of the echocardiogram and nuclear stress test for my personal review. Based on these findings I'm likely to recommend left and right heart catheterization to exclude any coronary ischemia as a cause of her symptoms although it sounds like she may very well have a nonischemic cardiomyopathy. She is ready on appropriate medications and feels much better after having some diuresis. She will need to continue to work on smoking cessation. We'll contact her when I have had an opportunity to review her studies and make a plan going forward.  Thanks for the kind referral.  Chrystie Nose, MD, [facility]  [place]  [facility]  Attending Cardiologist  Direct Dial: [phone]  Fax: [phone]  Website:  [email or web address] 04/29/2017, 5:52 PM

## 2017-04-30 ENCOUNTER — Telehealth: Payer: Self-pay | Admitting: Internal Medicine

## 2017-04-30 NOTE — Telephone Encounter (Signed)
Faxed Release signed by patient to [facility] to obtain records per Dr Rennis Golden.  lp

## 2017-05-07 ENCOUNTER — Telehealth: Payer: Self-pay | Admitting: Internal Medicine

## 2017-05-07 NOTE — Telephone Encounter (Signed)
[facility] as requested per Dr Rennis GoldenHilty.  Records given to Dr Rennis GoldenHilty for review. lp

## 2017-05-13 ENCOUNTER — Telehealth: Payer: Self-pay | Admitting: Internal Medicine

## 2017-05-13 NOTE — Telephone Encounter (Signed)
Patient returned call. 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 Advised that she should make an appt with Dr. Rennis GoldenHilty. She is unavailable next week. Offered appt this Thursday but patient said a June appt is fine. Was attempting to schedule appointment when [facility] [place]. Patient states is OK to call her back to schedule.

## 2017-05-13 NOTE — Telephone Encounter (Signed)
Contacted patient - scheduled 6/25 @ 9:40am

## 2017-05-13 NOTE — Telephone Encounter (Signed)
-----   Message from Chrystie NoseKenneth C Hilty, MD sent at 05/12/2017  8:22 AM EDT ----- Regarding: follow-up Reviewed her stress test - no ischemia, EF 19%. EF on echo is 20%. Suspect dilated cardiomyopathy. Will need follow-up appointment with me (none scheduled).  Dr. HRexene Edison

## 2017-05-13 NOTE — Telephone Encounter (Signed)
LM with lady to have patient return call

## 2017-06-23 ENCOUNTER — Encounter: Payer: Self-pay | Admitting: Internal Medicine

## 2017-06-23 ENCOUNTER — Ambulatory Visit (INDEPENDENT_AMBULATORY_CARE_PROVIDER_SITE_OTHER): Payer: Federal, State, Local not specified - PPO | Admitting: Internal Medicine

## 2017-06-23 VITALS — BP 118/78 | HR 95 | Ht 67.0 in | Wt 164.0 lb

## 2017-06-23 DIAGNOSIS — G4733 Obstructive sleep apnea (adult) (pediatric): Secondary | ICD-10-CM

## 2017-06-23 DIAGNOSIS — I5043 Acute on chronic combined systolic (congestive) and diastolic (congestive) heart failure: Secondary | ICD-10-CM | POA: Diagnosis not present

## 2017-06-23 DIAGNOSIS — I10 Essential (primary) hypertension: Secondary | ICD-10-CM | POA: Diagnosis not present

## 2017-06-23 DIAGNOSIS — Z720 Tobacco use: Secondary | ICD-10-CM

## 2017-06-23 DIAGNOSIS — I420 Dilated cardiomyopathy: Secondary | ICD-10-CM

## 2017-06-23 DIAGNOSIS — Z9989 Dependence on other enabling machines and devices: Secondary | ICD-10-CM

## 2017-06-23 HISTORY — DX: Obstructive sleep apnea (adult) (pediatric): G47.33

## 2017-06-23 HISTORY — DX: Dilated cardiomyopathy: I42.0

## 2017-06-23 NOTE — Patient Instructions (Signed)
Your physician recommends that you schedule a follow-up appointment as needed  

## 2017-06-23 NOTE — Progress Notes (Signed)
OFFICE CONSULT NOTE  Chief Complaint:  Follow-up CHF  Primary Care Physician: [facility]  HPI:  Kelly Frank is a 56 y.o. female who is being seen today for the evaluation of new onset heart failure at the request of Lollie Marrow, MD. Kelly Frank was recently seen at [facility] for shortness of breath in March 2018. She described it as fairly sudden onset after respiratory infection. She says her symptoms worsened despite recovering from a respiratory infection and did include some fever, leg pain and swelling. She is a smoker with past medical history significant for dyslipidemia and family history of heart failure in her mother however at older age of onset. Today she reports shortness of breath when walking several blocks. She says she tried to mow her lawn and was only able to make about 8 passes before she became short of breath. She denies any chest pain. According to records from [facility] she had an echocardiogram in the office. I personally reviewed the records but was not provided the images to review of her echo, however records suggest the EF was 20% with marked 4 chamber dilatation, moderately depressed RV systolic function, moderate MR and TR and moderate to severe pulmonary hypertension. In addition troponin was ordered and was abnormal and a stress test was performed as well. We will try to obtain those results. She was started on carvedilol, it ACE inhibitor, aspirin and Lasix.  06/23/2017  Kelly Frank returns today for follow-up. I did receive records from her cardiologist at [facility] which indicates she had a stress test which was negative for ischemia and showed a dilated cardiomyopathy. This is highly suggestive of nonischemic cardio myopathy. She has no anginal symptoms. Other than smoking and age which her biggest risk factors for coronary disease, I suspect that this is nonischemic. She recently was taken off of her lisinopril  and placed on Entresto 24/26 mg daily. Her dose of carvedilol was increased to 6.25 mg daily on at [facility]. We previously discussed possibility of heart catheterization however she seems to be improving. She says she recently could only mow about 5 links of her yard and now she can complete the whole yard push mowing without any problems. She is on CPAP and she notes that that is made a marked improvement in her symptoms. She continues to smoke about 5 cigarettes a day or so and she is working on quitting completely.  PMHx:  Past Medical History:  Diagnosis Date  . DOE (dyspnea on exertion)   . Hypertension   . Thyroid disease     Past Surgical History:  Procedure Laterality Date  . ABDOMINAL HYSTERECTOMY    . SHOULDER SURGERY  2000   left reconstructive   . SHOULDER SURGERY Left     FAMHx:  Family History  Problem Relation Age of Onset  . Diabetes Mother   . Hypertension Mother   . Heart attack Neg Hx   . Hyperlipidemia Neg Hx   . Sudden death Neg Hx     SOCHx:   reports that she has been smoking.  She has a 12.50 pack-year smoking history. She has never used smokeless tobacco. She reports that she drinks alcohol. She reports that she does not use drugs.  ALLERGIES:  No Known Allergies  ROS: Pertinent items noted in HPI and remainder of comprehensive ROS otherwise negative.  HOME MEDS: Current Outpatient Prescriptions on File Prior to Visit  Medication Sig Dispense Refill  .  aspirin EC 81 MG tablet Take 81 mg by mouth daily.    . baclofen (LIORESAL) 10 MG tablet Take 10 mg by mouth 3 (three) times daily.    . carvedilol (COREG) 3.125 MG tablet Take 1 tablet by mouth 2 (two) times daily.  3  . furosemide (LASIX) 20 MG tablet Take 20 mg by mouth daily.  0  . levothyroxine (SYNTHROID, LEVOTHROID) 200 MCG tablet Take 200 mcg by mouth daily.  2  . lisinopril (PRINIVIL,ZESTRIL) 10 MG tablet Take 10 mg by mouth daily.  3   No current facility-administered  medications on file prior to visit.     LABS/IMAGING: No results found for this or any previous visit (from the past 48 hour(s)). No results found.  LIPID PANEL: No results found for: CHOL, TRIG, HDL, CHOLHDL, VLDL, LDLCALC, LDLDIRECT  WEIGHTS: Wt Readings from Last 3 Encounters:  06/23/17 164 lb (74.4 kg)  04/29/17 172 lb (78 kg)  04/18/17 174 lb (78.9 kg)    VITALS: BP 118/78   Pulse 95   Ht 5\' 7"  (1.702 m)   Wt 164 lb (74.4 kg)   BMI 25.69 kg/m   EXAM: Deferred  EKG: Deferred  ASSESSMENT: 1. 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 Dyspnea on exertion-NYHA class II symptoms 3. Tobacco abuse 4. Hypertension 5. OSA on CPAP  PLAN: 1.   Kelly Frank is improving already and has noted marked improvement in exercise tolerance after medication adjustment by her primary cardiologist. She now is more like class II symptoms. She is on Entresto, carvedilol, aspirin and furosemide. She was started on CPAP for obstructive sleep apnea. Blood pressure is well-controlled. She seems to be at probably [facility]. Given her symptomatic improvement, would not advocate for catheterization at this time. Simply had repeat an echocardiogram in about 6 months to see if her LVEF has improved.  Follow-up with me as needed.  Kelly NoseKenneth C. Horald Birky, MD, [facility]  [place]  [facility]  Attending Cardiologist  Direct Dial: [phone]  Fax: [phone]  Website:  [email or web address]  Kelly AbuKenneth C Qais Jowers 06/23/2017, 10:20 AM

## 2017-10-08 ENCOUNTER — Encounter: Payer: Self-pay | Admitting: Obstetrics & Gynecology

## 2017-10-08 ENCOUNTER — Ambulatory Visit (INDEPENDENT_AMBULATORY_CARE_PROVIDER_SITE_OTHER): Payer: Federal, State, Local not specified - PPO | Admitting: Obstetrics & Gynecology

## 2017-10-08 VITALS — BP 120/80 | HR 90 | Ht 67.0 in | Wt 170.0 lb

## 2017-10-08 DIAGNOSIS — Z1231 Encounter for screening mammogram for malignant neoplasm of breast: Secondary | ICD-10-CM

## 2017-10-08 DIAGNOSIS — Z01419 Encounter for gynecological examination (general) (routine) without abnormal findings: Secondary | ICD-10-CM | POA: Diagnosis not present

## 2017-10-08 DIAGNOSIS — Z1272 Encounter for screening for malignant neoplasm of vagina: Secondary | ICD-10-CM | POA: Diagnosis not present

## 2017-10-08 DIAGNOSIS — Z0000 Encounter for general adult medical examination without abnormal findings: Secondary | ICD-10-CM | POA: Insufficient documentation

## 2017-10-08 DIAGNOSIS — Z1211 Encounter for screening for malignant neoplasm of colon: Secondary | ICD-10-CM

## 2017-10-08 DIAGNOSIS — Z1239 Encounter for other screening for malignant neoplasm of breast: Secondary | ICD-10-CM

## 2017-10-08 NOTE — Patient Instructions (Signed)
PAP every three years Mammogram every year    Call [phone] to schedule at [facility] Colonoscopy every 10 years Labs yearly (with PCP) 

## 2017-10-08 NOTE — Progress Notes (Signed)
HPI:      [name] is a 56 y.o. G1P1 who LMP was in the past, she presents today for her annual examination.  The patient has no complaints today. The patient is sexually active. Herlast pap: approximate date 2014 and was normal and last mammogram: approximate date 2017 and was normal.  The patient does perform self breast exams.  There is notable family history of breast or ovarian cancer in her family. The patient is not taking hormone replacement therapy. Patient denies post-menopausal vaginal bleeding.   The patient has regular exercise: yes. The patient denies current symptoms of depression.    GYN Hx: Last Colonoscopy:2 years ago. Normal.  Last DEXA: never ago.    PMHx: Past Medical History:  Diagnosis Date  . DOE (dyspnea on exertion)   . Hypertension   . Thyroid disease    Past Surgical History:  Procedure Laterality Date  . ABDOMINAL HYSTERECTOMY    . SHOULDER SURGERY  2000   left reconstructive   . SHOULDER SURGERY Left    Family History  Problem Relation Age of Onset  . Diabetes Mother   . Hypertension Mother   . Heart attack Neg Hx   . Hyperlipidemia Neg Hx   . Sudden death Neg Hx    Social History  Substance Use Topics  . Smoking status: Current Every Day Smoker    Packs/day: 0.50    Years: 25.00  . Smokeless tobacco: Never Used  . Alcohol use Yes     Comment: occasionally    Current Outpatient Prescriptions:  .  aspirin EC 81 MG tablet, Take 81 mg by mouth daily., Disp: , Rfl:  .  baclofen (LIORESAL) 10 MG tablet, Take 10 mg by mouth 3 (three) times daily as needed for muscle spasms., Disp: , Rfl:  .  carvedilol (COREG) 6.25 MG tablet, Take 6.25 mg by mouth 2 (two) times daily with a meal., Disp: , Rfl:  .  ENTRESTO 24-26 MG, Take 1 tablet by mouth 2 (two) times daily. , Disp: , Rfl:  .  furosemide (LASIX) 20 MG tablet, Take 20 mg by mouth daily., Disp: , Rfl: 0 .  levothyroxine (SYNTHROID, LEVOTHROID) 200 MCG tablet, Take 200 mcg by mouth daily.,  Disp: , Rfl: 2 Allergies: Patient has no known allergies.  Review of Systems  Constitutional: Negative for chills, fever and malaise/fatigue.  HENT: Negative for congestion, sinus pain and sore throat.   Eyes: Negative for blurred vision and pain.  Respiratory: Negative for cough and wheezing.   Cardiovascular: Negative for chest pain and leg swelling.  Gastrointestinal: Negative for abdominal pain, constipation, diarrhea, heartburn, nausea and vomiting.  Genitourinary: Negative for dysuria, frequency, hematuria and urgency.  Musculoskeletal: Negative for back pain, joint pain, myalgias and neck pain.  Skin: Negative for itching and rash.  Neurological: Negative for dizziness, tremors and weakness.  Endo/Heme/Allergies: Does not bruise/bleed easily.  Psychiatric/Behavioral: Negative for depression. The patient is not nervous/anxious and does not have insomnia.     Objective: BP 120/80   Pulse 90   Ht  (1.702 m)   Wt 170 lb (77.1 kg)   BMI 26.63 kg/m   Filed Weights   10/08/17 0935  Weight: 170 lb (77.1 kg)   Body mass index is 26.63 kg/m. Physical Exam  Constitutional: She is oriented to person, place, and time. She appears well-developed and well-nourished. No distress.  Genitourinary: Rectum normal and vagina normal. Pelvic exam was performed with patient supine. There is  no rash or lesion on the right labia. There is no rash or lesion on the left labia. Vagina exhibits no lesion. No bleeding in the vagina. Right adnexum does not display mass and does not display tenderness. Left adnexum does not display mass and does not display tenderness.  Genitourinary Comments: Absent Uterus Absent cervix Vaginal cuff well healed  HENT:  Head: Normocephalic and atraumatic. Head is without laceration.  Right Ear: Hearing normal.  Left Ear: Hearing normal.  Nose: No epistaxis.  No foreign bodies.  Mouth/Throat: Uvula is midline, oropharynx is clear and moist and mucous membranes are  normal.  Eyes: Pupils are equal, round, and reactive to light.  Neck: Normal range of motion. Neck supple. No thyromegaly present.  Cardiovascular: Normal rate and regular rhythm.  Exam reveals no gallop and no friction rub.   No murmur heard. Pulmonary/Chest: Effort normal and breath sounds normal. No respiratory distress. She has no wheezes. Right breast exhibits no mass, no skin change and no tenderness. Left breast exhibits no mass, no skin change and no tenderness.  Abdominal: Soft. Bowel sounds are normal. She exhibits no distension. There is no tenderness. There is no rebound.  Musculoskeletal: Normal range of motion.  Neurological: She is alert and oriented to person, place, and time. No cranial nerve deficit.  Skin: Skin is warm and dry.  Psychiatric: She has a normal mood and affect. Judgment normal.  Vitals reviewed.  Assessment: Annual Exam 1. Annual physical exam   2. Screening for vaginal cancer   3. Screening for breast cancer   4. Screen for colon cancer    Plan:            1.  Cervical Screening-  Pap smear done today  2. Breast screening- Exam annually and mammogram scheduled  3. Colonoscopy every 10 years, Hemoccult testing after age 54  4. Labs managed by PCP  5. Counseling for hormonal therapy: none    F/U  Return in about 1 year (around 10/08/2018) for Annual.  Annamarie Major, MD, [facility], [facility] 10/08/2017  9:57 AM

## 2017-10-10 LAB — PAP IG (IMAGE GUIDED): PAP Smear Comment: 0

## 2017-10-29 ENCOUNTER — Ambulatory Visit
Admission: RE | Admit: 2017-10-29 | Discharge: 2017-10-29 | Disposition: A | Discharge status: Home / Self Care | Payer: Federal, State, Local not specified - PPO | Source: Ambulatory Visit | Attending: Obstetrics & Gynecology | Admitting: Obstetrics & Gynecology

## 2017-10-29 DIAGNOSIS — Z1231 Encounter for screening mammogram for malignant neoplasm of breast: Secondary | ICD-10-CM | POA: Insufficient documentation

## 2017-10-29 DIAGNOSIS — Z1239 Encounter for other screening for malignant neoplasm of breast: Secondary | ICD-10-CM

## 2017-10-30 ENCOUNTER — Other Ambulatory Visit: Payer: Self-pay | Admitting: Obstetrics & Gynecology

## 2017-10-30 DIAGNOSIS — R928 Other abnormal and inconclusive findings on diagnostic imaging of breast: Secondary | ICD-10-CM

## 2017-10-30 DIAGNOSIS — N6489 Other specified disorders of breast: Secondary | ICD-10-CM

## 2017-11-03 ENCOUNTER — Other Ambulatory Visit: Payer: Self-pay | Admitting: Obstetrics & Gynecology

## 2017-11-03 DIAGNOSIS — R928 Other abnormal and inconclusive findings on diagnostic imaging of breast: Secondary | ICD-10-CM

## 2017-11-03 NOTE — Progress Notes (Signed)
[place] [facility], additional views are needed so Kelly Frank will contact the patient to schedule the appointment.

## 2017-11-03 NOTE — Progress Notes (Signed)
Orders for LEFT DX MMG and US entered and d/w pt.

## 2017-11-25 ENCOUNTER — Ambulatory Visit
Admission: RE | Admit: 2017-11-25 | Discharge: 2017-11-25 | Disposition: A | Discharge status: Home / Self Care | Payer: Federal, State, Local not specified - PPO | Source: Ambulatory Visit | Attending: Obstetrics & Gynecology | Admitting: Obstetrics & Gynecology

## 2017-11-25 DIAGNOSIS — R928 Other abnormal and inconclusive findings on diagnostic imaging of breast: Secondary | ICD-10-CM | POA: Insufficient documentation

## 2017-11-25 DIAGNOSIS — N6489 Other specified disorders of breast: Secondary | ICD-10-CM | POA: Insufficient documentation

## 2018-11-09 ENCOUNTER — Ambulatory Visit (INDEPENDENT_AMBULATORY_CARE_PROVIDER_SITE_OTHER): Payer: Federal, State, Local not specified - PPO | Admitting: Obstetrics & Gynecology

## 2018-11-09 ENCOUNTER — Encounter: Payer: Self-pay | Admitting: Obstetrics & Gynecology

## 2018-11-09 VITALS — BP 130/80 | Ht 67.0 in | Wt 194.0 lb

## 2018-11-09 DIAGNOSIS — Z01419 Encounter for gynecological examination (general) (routine) without abnormal findings: Secondary | ICD-10-CM

## 2018-11-09 DIAGNOSIS — Z0000 Encounter for general adult medical examination without abnormal findings: Secondary | ICD-10-CM

## 2018-11-09 DIAGNOSIS — Z1239 Encounter for other screening for malignant neoplasm of breast: Secondary | ICD-10-CM

## 2018-11-09 DIAGNOSIS — Z1211 Encounter for screening for malignant neoplasm of colon: Secondary | ICD-10-CM

## 2018-11-09 NOTE — Patient Instructions (Signed)
PAP every 5 years Mammogram every year    Call [phone] to schedule at [place] Colonoscopy every 10 years Labs yearly (with PCP) 

## 2018-11-09 NOTE — Progress Notes (Signed)
HPI:      [name] is a 57 y.o. G1P1 who LMP was in the past, she presents today for her annual examination.  The patient has no complaints today. The patient is sexually active. Herlast pap: approximate date 2018 and was normal and last mammogram: approximate date 2018 and was normal.  The patient does perform self breast exams.  There is no notable family history of breast or ovarian cancer in her family. The patient is not taking hormone replacement therapy. Patient denies post-menopausal vaginal bleeding.   The patient has regular exercise: yes. The patient denies current symptoms of depression.    GYN Hx: Last Colonoscopy:3 years ago. Normal.   PMHx: Past Medical History:  Diagnosis Date  . DOE (dyspnea on exertion)   . Hypertension   . Thyroid disease    Past Surgical History:  Procedure Laterality Date  . ABDOMINAL HYSTERECTOMY    . SHOULDER SURGERY  2000   left reconstructive   . SHOULDER SURGERY Left    Family History  Problem Relation Age of Onset  . Diabetes Mother   . Hypertension Mother   . Breast cancer Sister 49  . Heart attack Neg Hx   . Hyperlipidemia Neg Hx   . Sudden death Neg Hx    Social History   Tobacco Use  . Smoking status: Current Every Day Smoker    Packs/day: 0.50    Years: 25.00    Pack years: 12.50  . Smokeless tobacco: Never Used  Substance Use Topics  . Alcohol use: Yes    Comment: occasionally  . Drug use: No    Current Outpatient Medications:  .  aspirin EC 81 MG tablet, Take 81 mg by mouth daily., Disp: , Rfl:  .  baclofen (LIORESAL) 10 MG tablet, Take 10 mg by mouth 3 (three) times daily as needed for muscle spasms., Disp: , Rfl:  .  carvedilol (COREG) 6.25 MG tablet, Take 6.25 mg by mouth 2 (two) times daily with a meal., Disp: , Rfl:  .  ENTRESTO 24-26 MG, Take 1 tablet by mouth 2 (two) times daily. , Disp: , Rfl:  .  furosemide (LASIX) 20 MG tablet, Take 20 mg by mouth daily., Disp: , Rfl: 0 .  levothyroxine  (SYNTHROID, LEVOTHROID) 200 MCG tablet, Take 200 mcg by mouth daily., Disp: , Rfl: 2 Allergies: Patient has no known allergies.  Review of Systems  Constitutional: Negative for chills, fever and malaise/fatigue.  HENT: Negative for congestion, sinus pain and sore throat.   Eyes: Negative for blurred vision and pain.  Respiratory: Negative for cough and wheezing.   Cardiovascular: Negative for chest pain and leg swelling.  Gastrointestinal: Negative for abdominal pain, constipation, diarrhea, heartburn, nausea and vomiting.  Genitourinary: Negative for dysuria, frequency, hematuria and urgency.  Musculoskeletal: Negative for back pain, joint pain, myalgias and neck pain.  Skin: Negative for itching and rash.  Neurological: Negative for dizziness, tremors and weakness.  Endo/Heme/Allergies: Does not bruise/bleed easily.  Psychiatric/Behavioral: Negative for depression. The patient is not nervous/anxious and does not have insomnia.    Objective: BP 130/80   Ht 5\' 7"  (1.702 m)   Wt 194 lb (88 kg)   BMI 30.38 kg/m   Filed Weights   11/09/18 1438  Weight: 194 lb (88 kg)   Body mass index is 30.38 kg/m. Physical Exam  Constitutional: She is oriented to person, place, and time. She appears well-developed and well-nourished. No distress.  Genitourinary: Rectum normal and vagina  normal. Pelvic exam was performed with patient supine. There is no rash or lesion on the right labia. There is no rash or lesion on the left labia. Vagina exhibits no lesion. No bleeding in the vagina. Right adnexum does not display mass and does not display tenderness. Left adnexum does not display mass and does not display tenderness.  Genitourinary Comments: Absent Uterus Absent cervix Vaginal cuff well healed  HENT:  Head: Normocephalic and atraumatic. Head is without laceration.  Right Ear: Hearing normal.  Left Ear: Hearing normal.  Nose: No epistaxis.  No foreign bodies.  Mouth/Throat: Uvula is midline,  oropharynx is clear and moist and mucous membranes are normal.  Eyes: Pupils are equal, round, and reactive to light.  Neck: Normal range of motion. Neck supple. No thyromegaly present.  Cardiovascular: Normal rate and regular rhythm. Exam reveals no gallop and no friction rub.  No murmur heard. Pulmonary/Chest: Effort normal and breath sounds normal. No respiratory distress. She has no wheezes. Right breast exhibits no mass, no skin change and no tenderness. Left breast exhibits no mass, no skin change and no tenderness.  Abdominal: Soft. Bowel sounds are normal. She exhibits no distension. There is no tenderness. There is no rebound.  Musculoskeletal: Normal range of motion.  Neurological: She is alert and oriented to person, place, and time. No cranial nerve deficit.  Skin: Skin is warm and dry.  Psychiatric: She has a normal mood and affect. Judgment normal.  Vitals reviewed.  Assessment: Annual Exam 1. Annual physical exam   2. Screening for breast cancer   3. Screen for colon cancer    Plan:            1.  Cervical Screening-  Pap smear schedule reviewed with patient  2. Breast screening- Exam annually and mammogram scheduled  3. Colonoscopy every 10 years, Hemoccult testing after age 10  4. Labs managed by PCP  5. Counseling for hormonal therapy: none  6. Declines Flu shot    F/U  Return in about 1 year (around 11/10/2019) for Annual.  Annamarie Major, MD, [facility], [facility] 11/09/2018  2:53 PM

## 2018-11-22 LAB — FECAL OCCULT BLOOD, IMMUNOCHEMICAL: FECAL OCCULT BLD: NEGATIVE

## 2018-12-15 ENCOUNTER — Ambulatory Visit
Admission: RE | Admit: 2018-12-15 | Discharge: 2018-12-15 | Disposition: A | Discharge status: Home / Self Care | Payer: Federal, State, Local not specified - PPO | Source: Ambulatory Visit | Attending: Obstetrics & Gynecology | Admitting: Obstetrics & Gynecology

## 2018-12-15 DIAGNOSIS — Z1239 Encounter for other screening for malignant neoplasm of breast: Secondary | ICD-10-CM | POA: Insufficient documentation

## 2019-11-15 ENCOUNTER — Ambulatory Visit (INDEPENDENT_AMBULATORY_CARE_PROVIDER_SITE_OTHER): Payer: Federal, State, Local not specified - PPO | Admitting: Obstetrics & Gynecology

## 2019-11-15 ENCOUNTER — Other Ambulatory Visit: Payer: Self-pay

## 2019-11-15 ENCOUNTER — Encounter: Payer: Self-pay | Admitting: Obstetrics & Gynecology

## 2019-11-15 VITALS — BP 110/60 | Ht 67.0 in | Wt 188.0 lb

## 2019-11-15 DIAGNOSIS — Z01419 Encounter for gynecological examination (general) (routine) without abnormal findings: Secondary | ICD-10-CM

## 2019-11-15 DIAGNOSIS — Z1231 Encounter for screening mammogram for malignant neoplasm of breast: Secondary | ICD-10-CM

## 2019-11-15 DIAGNOSIS — Z1211 Encounter for screening for malignant neoplasm of colon: Secondary | ICD-10-CM

## 2019-11-15 NOTE — Progress Notes (Signed)
HPI:      Kelly Frank is a 58 y.o. G1P1 who LMP was in the past (prior hysterectomy), she presents today for her annual examination.  The patient has no complaints today. The patient is sexually active. Herlast pap: approximate date 2018 and was normal and last mammogram: approximate date 2019 and was normal.  The patient does perform self breast exams.  There is no notable family history of breast or ovarian cancer in her family. The patient is not taking hormone replacement therapy. Patient denies post-menopausal vaginal bleeding.   The patient has regular exercise: yes. The patient denies current symptoms of depression.    GYN Hx: Last Colonoscopy:4 years ago. Normal.  Last DEXA: never ago.    PMHx: Past Medical History:  Diagnosis Date  . DOE (dyspnea on exertion)   . Hypertension   . Thyroid disease    Past Surgical History:  Procedure Laterality Date  . ABDOMINAL HYSTERECTOMY    . SHOULDER SURGERY  2000   left reconstructive   . SHOULDER SURGERY Left    Family History  Problem Relation Age of Onset  . Diabetes Mother   . Hypertension Mother   . Breast cancer Sister 60  . Heart attack Neg Hx   . Hyperlipidemia Neg Hx   . Sudden death Neg Hx    Social History   Tobacco Use  . Smoking status: Current Every Day Smoker    Packs/day: 0.50    Years: 25.00    Pack years: 12.50  . Smokeless tobacco: Never Used  Substance Use Topics  . Alcohol use: Yes    Comment: occasionally  . Drug use: No    Current Outpatient Medications:  .  aspirin EC 81 MG tablet, Take 81 mg by mouth daily., Disp: , Rfl:  .  baclofen (LIORESAL) 10 MG tablet, Take 10 mg by mouth 3 (three) times daily as needed for muscle spasms., Disp: , Rfl:  .  carvedilol (COREG) 6.25 MG tablet, Take 6.25 mg by mouth 2 (two) times daily with a meal., Disp: , Rfl:  .  ENTRESTO 24-26 MG, Take 1 tablet by mouth 2 (two) times daily. , Disp: , Rfl:  .  furosemide (LASIX) 20 MG tablet, Take 20 mg by mouth  daily., Disp: , Rfl: 0 .  levothyroxine (SYNTHROID, LEVOTHROID) 200 MCG tablet, Take 200 mcg by mouth daily., Disp: , Rfl: 2 Allergies: Patient has no known allergies.  Review of Systems  Constitutional: Positive for malaise/fatigue. Negative for chills and fever.  HENT: Negative for congestion, sinus pain and sore throat.   Eyes: Negative for blurred vision and pain.  Respiratory: Negative for cough and wheezing.   Cardiovascular: Negative for chest pain and leg swelling.  Gastrointestinal: Negative for abdominal pain, constipation, diarrhea, heartburn, nausea and vomiting.  Genitourinary: Negative for dysuria, frequency, hematuria and urgency.  Musculoskeletal: Negative for back pain, joint pain, myalgias and neck pain.  Skin: Negative for itching and rash.  Neurological: Negative for dizziness, tremors and weakness.  Endo/Heme/Allergies: Does not bruise/bleed easily.  Psychiatric/Behavioral: Negative for depression. The patient is not nervous/anxious and does not have insomnia.     Objective: BP 110/60   Ht 5\' 7"  (1.702 m)   Wt 188 lb (85.3 kg)   BMI 29.44 kg/m   Filed Weights   11/15/19 0800  Weight: 188 lb (85.3 kg)   Body mass index is 29.44 kg/m. Physical Exam Constitutional:      General: She is not in acute  distress.    Appearance: She is well-developed.  Genitourinary:     Pelvic exam was performed with patient supine.     Vagina and rectum normal.     No lesions in the vagina.     No vaginal bleeding.     No right or left adnexal mass present.     Right adnexa not tender.     Left adnexa not tender.     Genitourinary Comments: Absent Uterus Absent cervix Vaginal cuff well healed  HENT:     Head: Normocephalic and atraumatic. No laceration.     Right Ear: Hearing normal.     Left Ear: Hearing normal.     Mouth/Throat:     Pharynx: Uvula midline.  Eyes:     Pupils: Pupils are equal, round, and reactive to light.  Neck:     Musculoskeletal: Normal range  of motion and neck supple.     Thyroid: No thyromegaly.  Cardiovascular:     Rate and Rhythm: Normal rate and regular rhythm.     Heart sounds: No murmur. No friction rub. No gallop.   Pulmonary:     Effort: Pulmonary effort is normal. No respiratory distress.     Breath sounds: Normal breath sounds. No wheezing.  Chest:     Breasts:        Right: No mass, skin change or tenderness.        Left: No mass, skin change or tenderness.  Abdominal:     General: Bowel sounds are normal. There is no distension.     Palpations: Abdomen is soft.     Tenderness: There is no abdominal tenderness. There is no rebound.  Musculoskeletal: Normal range of motion.  Neurological:     Mental Status: She is alert and oriented to person, place, and time.     Cranial Nerves: No cranial nerve deficit.  Skin:    General: Skin is warm and dry.  Psychiatric:        Judgment: Judgment normal.  Vitals signs reviewed.     Assessment: Annual Exam 1. Women's annual routine gynecological examination   2. Encounter for screening mammogram for malignant neoplasm of breast   3. Screen for colon cancer     Plan:            1.  Cervical Screening-  Pap smear schedule reviewed with patient, every 5 years  2. Breast screening- Exam annually and mammogram scheduled  3. Colonoscopy every 10 years, Hemoccult testing after age 85  4. Labs managed by PCP  5. Counseling for hormonal therapy: none              6. FRAX - FRAX score for assessing the 10 year probability for fracture calculated and discussed today.  Based on age and score today, DEXA is not currently scheduled.    F/U  Return in about 1 year (around 11/14/2020) for Annual.  Annamarie Major, MD, [facility], [facility] 11/15/2019  8:16 AM

## 2019-11-15 NOTE — Patient Instructions (Signed)
PAP every three years Mammogram every year    Call [phone] to schedule at [facility] Colonoscopy every 10 years Labs yearly (with PCP)   

## 2019-12-21 ENCOUNTER — Ambulatory Visit
Admission: RE | Admit: 2019-12-21 | Discharge: 2019-12-21 | Disposition: A | Discharge status: Home / Self Care | Payer: Federal, State, Local not specified - PPO | Source: Ambulatory Visit | Attending: Obstetrics & Gynecology | Admitting: Obstetrics & Gynecology

## 2019-12-21 DIAGNOSIS — Z1231 Encounter for screening mammogram for malignant neoplasm of breast: Secondary | ICD-10-CM

## 2020-02-28 DIAGNOSIS — M25561 Pain in right knee: Secondary | ICD-10-CM | POA: Insufficient documentation

## 2020-11-20 ENCOUNTER — Other Ambulatory Visit (HOSPITAL_COMMUNITY)
Admission: RE | Admit: 2020-11-20 | Discharge: 2020-11-20 | Disposition: A | Discharge status: Home / Self Care | Payer: Federal, State, Local not specified - PPO | Source: Ambulatory Visit | Attending: Obstetrics & Gynecology | Admitting: Obstetrics & Gynecology

## 2020-11-20 ENCOUNTER — Encounter: Payer: Self-pay | Admitting: Obstetrics & Gynecology

## 2020-11-20 ENCOUNTER — Other Ambulatory Visit: Payer: Self-pay

## 2020-11-20 ENCOUNTER — Ambulatory Visit (INDEPENDENT_AMBULATORY_CARE_PROVIDER_SITE_OTHER): Payer: Federal, State, Local not specified - PPO | Admitting: Obstetrics & Gynecology

## 2020-11-20 VITALS — BP 120/80 | Ht 67.0 in | Wt 201.0 lb

## 2020-11-20 DIAGNOSIS — Z1211 Encounter for screening for malignant neoplasm of colon: Secondary | ICD-10-CM

## 2020-11-20 DIAGNOSIS — Z1272 Encounter for screening for malignant neoplasm of vagina: Secondary | ICD-10-CM | POA: Insufficient documentation

## 2020-11-20 DIAGNOSIS — Z1231 Encounter for screening mammogram for malignant neoplasm of breast: Secondary | ICD-10-CM

## 2020-11-20 DIAGNOSIS — Z01419 Encounter for gynecological examination (general) (routine) without abnormal findings: Secondary | ICD-10-CM

## 2020-11-20 NOTE — Progress Notes (Signed)
HPI:      [name] is a 59 y.o. G1P1 who LMP was in the past, she presents today for her annual examination.  The patient has no complaints today. The patient is sexually active. Herlast pap: approximate date 2018 and was normal and last mammogram: approximate date 2020 and was normal.  The patient does perform self breast exams.  There is no notable family history of breast or ovarian cancer in her family. The patient is not taking hormone replacement therapy. Patient denies post-menopausal vaginal bleeding.   The patient has regular exercise: yes. The patient denies current symptoms of depression.    GYN Hx: Last Colonoscopy:5 years ago. Normal.   PMHx: Past Medical History:  Diagnosis Date  . DOE (dyspnea on exertion)   . Hypertension   . Thyroid disease    Past Surgical History:  Procedure Laterality Date  . ABDOMINAL HYSTERECTOMY    . SHOULDER SURGERY  2000   left reconstructive   . SHOULDER SURGERY Left    Family History  Problem Relation Age of Onset  . Diabetes Mother   . Hypertension Mother   . Breast cancer Sister 63  . Heart attack Neg Hx   . Hyperlipidemia Neg Hx   . Sudden death Neg Hx    Social History   Tobacco Use  . Smoking status: Current Every Day Smoker    Packs/day: 0.50    Years: 25.00    Pack years: 12.50  . Smokeless tobacco: Never Used  Vaping Use  . Vaping Use: Some days  Substance Use Topics  . Alcohol use: Yes    Comment: occasionally  . Drug use: No    Current Outpatient Medications:  .  aspirin EC 81 MG tablet, Take 81 mg by mouth daily., Disp: , Rfl:  .  carvedilol (COREG) 6.25 MG tablet, Take 6.25 mg by mouth 2 (two) times daily with a meal., Disp: , Rfl:  .  ENTRESTO 24-26 MG, Take 1 tablet by mouth 2 (two) times daily. , Disp: , Rfl:  .  furosemide (LASIX) 20 MG tablet, Take 20 mg by mouth daily., Disp: , Rfl: 0 .  levothyroxine (SYNTHROID, LEVOTHROID) 200 MCG tablet, Take 200 mcg by mouth daily., Disp: , Rfl: 2 .   baclofen (LIORESAL) 10 MG tablet, Take 10 mg by mouth 3 (three) times daily as needed for muscle spasms. (Patient not taking: Reported on 11/20/2020), Disp: , Rfl:  Allergies: Patient has no known allergies.  Review of Systems  Constitutional: Negative for chills, fever and malaise/fatigue.  HENT: Negative for congestion, sinus pain and sore throat.   Eyes: Negative for blurred vision and pain.  Respiratory: Negative for cough and wheezing.   Cardiovascular: Negative for chest pain and leg swelling.  Gastrointestinal: Negative for abdominal pain, constipation, diarrhea, heartburn, nausea and vomiting.  Genitourinary: Negative for dysuria, frequency, hematuria and urgency.  Musculoskeletal: Negative for back pain, joint pain, myalgias and neck pain.  Skin: Negative for itching and rash.  Neurological: Negative for dizziness, tremors and weakness.  Endo/Heme/Allergies: Does not bruise/bleed easily.  Psychiatric/Behavioral: Negative for depression. The patient is not nervous/anxious and does not have insomnia.     Objective: BP 120/80   Ht 5\' 7"  (1.702 m)   Wt 201 lb (91.2 kg)   BMI 31.48 kg/m   Filed Weights   11/20/20 0806  Weight: 201 lb (91.2 kg)   Body mass index is 31.48 kg/m. Physical Exam Constitutional:      General:  She is not in acute distress.    Appearance: She is well-developed.  Genitourinary:     Pelvic exam was performed with patient supine.     Vulva, urethra, bladder, vagina and rectum normal.     No lesions in the vagina.     No vaginal bleeding.     Cervix is absent.     Uterus is absent.     No right or left adnexal mass present.     Right adnexa not tender.     Left adnexa not tender.     Genitourinary Comments: Vaginal cuff well healed  HENT:     Head: Normocephalic and atraumatic. No laceration.     Right Ear: Hearing normal.     Left Ear: Hearing normal.     Mouth/Throat:     Pharynx: Uvula midline.  Eyes:     Pupils: Pupils are equal, round,  and reactive to light.  Neck:     Thyroid: No thyromegaly.  Cardiovascular:     Rate and Rhythm: Normal rate and regular rhythm.     Heart sounds: No murmur heard.  No friction rub. No gallop.   Pulmonary:     Effort: Pulmonary effort is normal. No respiratory distress.     Breath sounds: Normal breath sounds. No wheezing.  Chest:     Breasts:        Right: No mass, skin change or tenderness.        Left: No mass, skin change or tenderness.  Abdominal:     General: Bowel sounds are normal. There is no distension.     Palpations: Abdomen is soft.     Tenderness: There is no abdominal tenderness. There is no rebound.  Musculoskeletal:        General: Normal range of motion.     Cervical back: Normal range of motion and neck supple.  Neurological:     Mental Status: She is alert and oriented to person, place, and time.     Cranial Nerves: No cranial nerve deficit.  Skin:    General: Skin is warm and dry.  Psychiatric:        Judgment: Judgment normal.  Vitals reviewed.     Assessment: Annual Exam 1. Women's annual routine gynecological examination   2. Encounter for screening mammogram for malignant neoplasm of breast   3. Screen for colon cancer   4. Screening for vaginal cancer     Plan:            1.  Vaginal Screening-  Pap smear done today  2. Breast screening- Exam annually and mammogram scheduled  3. Colonoscopy every 10 years, Hemoccult testing after age 40  4. Labs managed by PCP  5. Counseling for hormonal therapy: none    F/U  Return in about 1 year (around 11/20/2021) for Annual.  Annamarie Major, MD, [facility], [facility] 11/20/2020  8:28 AM

## 2020-11-20 NOTE — Patient Instructions (Signed)
PAP every three years Mammogram every year    Call [phone] to schedule at [facility] Colonoscopy every 10 years Labs yearly (with PCP)  Thank you for choosing [facility]. As part of our ongoing efforts to improve patient experience, we would appreciate your feedback. Please fill out the short survey that you will receive by mail or MyChart. Your opinion is important to us! - Dr. Katleen Carraway   

## 2020-11-23 LAB — SPECIMEN STATUS REPORT

## 2020-11-23 LAB — FECAL OCCULT BLOOD, IMMUNOCHEMICAL: Fecal Occult Bld: NEGATIVE

## 2020-11-24 LAB — CYTOLOGY - PAP
Comment: NEGATIVE
Diagnosis: UNDETERMINED — AB
High risk HPV: NEGATIVE

## 2020-12-21 ENCOUNTER — Other Ambulatory Visit: Payer: Self-pay

## 2020-12-21 ENCOUNTER — Ambulatory Visit
Admission: RE | Admit: 2020-12-21 | Discharge: 2020-12-21 | Disposition: A | Discharge status: Home / Self Care | Payer: Federal, State, Local not specified - PPO | Source: Ambulatory Visit | Attending: Obstetrics & Gynecology | Admitting: Obstetrics & Gynecology

## 2020-12-21 DIAGNOSIS — Z1231 Encounter for screening mammogram for malignant neoplasm of breast: Secondary | ICD-10-CM | POA: Insufficient documentation

## 2022-02-01 ENCOUNTER — Other Ambulatory Visit: Payer: Self-pay

## 2022-02-01 ENCOUNTER — Encounter: Payer: Self-pay | Admitting: Obstetrics & Gynecology

## 2022-02-01 ENCOUNTER — Ambulatory Visit (INDEPENDENT_AMBULATORY_CARE_PROVIDER_SITE_OTHER): Payer: Federal, State, Local not specified - PPO | Admitting: Obstetrics & Gynecology

## 2022-02-01 ENCOUNTER — Other Ambulatory Visit (HOSPITAL_COMMUNITY)
Admission: RE | Admit: 2022-02-01 | Discharge: 2022-02-01 | Disposition: A | Discharge status: Home / Self Care | Payer: Federal, State, Local not specified - PPO | Source: Ambulatory Visit | Attending: Obstetrics & Gynecology | Admitting: Obstetrics & Gynecology

## 2022-02-01 ENCOUNTER — Ambulatory Visit
Admission: RE | Admit: 2022-02-01 | Discharge: 2022-02-01 | Disposition: A | Discharge status: Home / Self Care | Payer: Federal, State, Local not specified - PPO | Source: Ambulatory Visit | Attending: Obstetrics & Gynecology | Admitting: Obstetrics & Gynecology

## 2022-02-01 VITALS — BP 120/80 | Ht 67.0 in | Wt 211.0 lb

## 2022-02-01 DIAGNOSIS — Z1231 Encounter for screening mammogram for malignant neoplasm of breast: Secondary | ICD-10-CM

## 2022-02-01 DIAGNOSIS — R87620 Atypical squamous cells of undetermined significance on cytologic smear of vagina (ASC-US): Secondary | ICD-10-CM | POA: Insufficient documentation

## 2022-02-01 DIAGNOSIS — R87811 Vaginal high risk human papillomavirus (HPV) DNA test positive: Secondary | ICD-10-CM | POA: Diagnosis present

## 2022-02-01 DIAGNOSIS — Z01419 Encounter for gynecological examination (general) (routine) without abnormal findings: Secondary | ICD-10-CM

## 2022-02-01 NOTE — Progress Notes (Signed)
HPI:      Kelly Frank is a 61 y.o. G1P1 who LMP was in the past, she presents today for her annual examination.  The patient has no complaints today. The patient is not currently sexually active. Herlast pap: approximate date 2021 and was abnormal: ASCUS (vag PAP) and last mammogram: approximate date 2021 and was normal.  The patient does perform self breast exams.  There is no notable family history of breast or ovarian cancer in her family. The patient is not taking hormone replacement therapy. Patient denies post-menopausal vaginal bleeding.   The patient has regular exercise: yes. The patient denies current symptoms of depression.    GYN Hx: Last Colonoscopy: 6 yrs  ago. Normal.  Last DEXA:  never  ago.    PMHx: Past Medical History:  Diagnosis Date   DOE (dyspnea on exertion)    Hypertension    Thyroid disease    Past Surgical History:  Procedure Laterality Date   ABDOMINAL HYSTERECTOMY     SHOULDER SURGERY  2000   left reconstructive    SHOULDER SURGERY Left    Family History  Problem Relation Age of Onset   Diabetes Mother    Hypertension Mother    Breast cancer Sister 24   Heart attack Neg Hx    Hyperlipidemia Neg Hx    Sudden death Neg Hx    Social History   Tobacco Use   Smoking status: Every Day    Packs/day: 0.50    Years: 25.00    Pack years: 12.50    Types: Cigarettes   Smokeless tobacco: Never  Vaping Use   Vaping Use: Some days  Substance Use Topics   Alcohol use: Yes    Comment: occasionally   Drug use: No    Current Outpatient Medications:    aspirin EC 81 MG tablet, Take 81 mg by mouth daily., Disp: , Rfl:    carvedilol (COREG) 6.25 MG tablet, Take 6.25 mg by mouth 2 (two) times daily with a meal., Disp: , Rfl:    ENTRESTO 24-26 MG, Take 1 tablet by mouth 2 (two) times daily. , Disp: , Rfl:    furosemide (LASIX) 20 MG tablet, Take 20 mg by mouth daily., Disp: , Rfl: 0   levothyroxine (SYNTHROID, LEVOTHROID) 200 MCG tablet, Take 200 mcg by  mouth daily., Disp: , Rfl: 2   baclofen (LIORESAL) 10 MG tablet, Take 10 mg by mouth 3 (three) times daily as needed for muscle spasms. (Patient not taking: Reported on 11/20/2020), Disp: , Rfl:  Allergies: Patient has no known allergies.  Review of Systems  Constitutional:  Negative for chills, fever and malaise/fatigue.  HENT:  Negative for congestion, sinus pain and sore throat.   Eyes:  Negative for blurred vision and pain.  Respiratory:  Negative for cough and wheezing.   Cardiovascular:  Negative for chest pain and leg swelling.  Gastrointestinal:  Negative for abdominal pain, constipation, diarrhea, heartburn, nausea and vomiting.  Genitourinary:  Negative for dysuria, frequency, hematuria and urgency.  Musculoskeletal:  Negative for back pain, joint pain, myalgias and neck pain.  Skin:  Negative for itching and rash.  Neurological:  Negative for dizziness, tremors and weakness.  Endo/Heme/Allergies:  Does not bruise/bleed easily.  Psychiatric/Behavioral:  Negative for depression. The patient is not nervous/anxious and does not have insomnia.    Objective: BP 120/80    Ht 5\' 7"  (1.702 m)    Wt 211 lb (95.7 kg)    BMI 33.05 kg/m  Filed Weights   02/01/22 1315  Weight: 211 lb (95.7 kg)   Body mass index is 33.05 kg/m. Physical Exam Constitutional:      General: She is not in acute distress.    Appearance: She is well-developed.  Genitourinary:     Vulva, bladder, rectum and urethral meatus normal.     No lesions in the vagina.     Genitourinary Comments: Vaginal cuff well healed     Right Labia: No rash, tenderness or lesions.    Left Labia: No tenderness, lesions or rash.    No vaginal bleeding.      Right Adnexa: not tender and no mass present.    Left Adnexa: not tender and no mass present.    Cervix is absent.     Uterus is absent.     Pelvic exam was performed with patient in the lithotomy position.  Breasts:    Right: No mass, skin change or tenderness.      Left: No mass, skin change or tenderness.  HENT:     Head: Normocephalic and atraumatic. No laceration.     Right Ear: Hearing normal.     Left Ear: Hearing normal.     Mouth/Throat:     Pharynx: Uvula midline.  Eyes:     Pupils: Pupils are equal, round, and reactive to light.  Neck:     Thyroid: No thyromegaly.  Cardiovascular:     Rate and Rhythm: Normal rate and regular rhythm.     Heart sounds: No murmur heard.   No friction rub. No gallop.  Pulmonary:     Effort: Pulmonary effort is normal. No respiratory distress.     Breath sounds: Normal breath sounds. No wheezing.  Abdominal:     General: Bowel sounds are normal. There is no distension.     Palpations: Abdomen is soft.     Tenderness: There is no abdominal tenderness. There is no rebound.  Musculoskeletal:        General: Normal range of motion.     Cervical back: Normal range of motion and neck supple.  Neurological:     Mental Status: She is alert and oriented to person, place, and time.     Cranial Nerves: No cranial nerve deficit.  Skin:    General: Skin is warm and dry.  Psychiatric:        Judgment: Judgment normal.  Vitals reviewed.    Assessment: Annual Exam 1. Women's annual routine gynecological examination   2. ASCUS with positive high risk human papillomavirus of vagina   3. Encounter for screening mammogram for malignant neoplasm of breast     Plan:            1.  Vaginal Screening-  Pap smear done today  2. Breast screening- Exam annually and mammogram scheduled  3. Colonoscopy every 10 years, Hemoccult testing after age 62  4. Labs managed by PCP  5. Counseling for hormonal therapy: none              6. FRAX - FRAX score for assessing the 10 year probability for fracture calculated and discussed today.  Based on age and score today, DEXA is not scheduled.    F/U  No follow-ups on file.             1 year follow up recommended  Barnett Applebaum, MD, [facility], [facility] 02/01/2022  1:49 PM

## 2022-02-01 NOTE — Patient Instructions (Signed)
Thank you for choosing [facility]. As part of our ongoing efforts to improve patient experience, we would appreciate your feedback. Please fill out the short survey that you will receive by mail or MyChart. Your opinion is important to us! -Dr Darlis Wragg  Recommendations to boost your immunity to prevent illness such as viral flu and colds, including covid19, are as follows:       - - -  Vitamin K2 and Vitamin D3  - - - Take Vitamin K2 at 200-300 mcg daily (usually 2-3 pills daily of the over the counter formulation). Take Vitamin D3 at 3000-4000 U daily (usually 3-4 pills daily of the over the counter formulation). Studies show that these two at high normal levels in your system are very effective in keeping your immunity so strong and protective that you will be unlikely to contract viral illness such as those listed above.  Dr Abriel Geesey  

## 2022-02-06 LAB — CYTOLOGY - PAP: Diagnosis: NEGATIVE

## 2023-06-10 ENCOUNTER — Encounter (HOSPITAL_BASED_OUTPATIENT_CLINIC_OR_DEPARTMENT_OTHER): Payer: Self-pay | Admitting: Urology

## 2023-06-10 ENCOUNTER — Inpatient Hospital Stay (HOSPITAL_BASED_OUTPATIENT_CLINIC_OR_DEPARTMENT_OTHER)
Admission: EM | Admit: 2023-06-10 | Discharge: 2023-06-13 | DRG: 444 | Disposition: A | Discharge status: Home / Self Care | Payer: Federal, State, Local not specified - PPO | Attending: Internal Medicine | Admitting: Internal Medicine

## 2023-06-10 ENCOUNTER — Other Ambulatory Visit: Payer: Self-pay

## 2023-06-10 ENCOUNTER — Emergency Department (HOSPITAL_BASED_OUTPATIENT_CLINIC_OR_DEPARTMENT_OTHER): Payer: Federal, State, Local not specified - PPO

## 2023-06-10 DIAGNOSIS — E876 Hypokalemia: Secondary | ICD-10-CM | POA: Diagnosis present

## 2023-06-10 DIAGNOSIS — K838 Other specified diseases of biliary tract: Secondary | ICD-10-CM | POA: Diagnosis present

## 2023-06-10 DIAGNOSIS — Z8249 Family history of ischemic heart disease and other diseases of the circulatory system: Secondary | ICD-10-CM

## 2023-06-10 DIAGNOSIS — I110 Hypertensive heart disease with heart failure: Secondary | ICD-10-CM | POA: Diagnosis present

## 2023-06-10 DIAGNOSIS — E66811 Obesity, class 1: Secondary | ICD-10-CM | POA: Diagnosis present

## 2023-06-10 DIAGNOSIS — F17210 Nicotine dependence, cigarettes, uncomplicated: Secondary | ICD-10-CM | POA: Diagnosis present

## 2023-06-10 DIAGNOSIS — K810 Acute cholecystitis: Secondary | ICD-10-CM | POA: Diagnosis not present

## 2023-06-10 DIAGNOSIS — R112 Nausea with vomiting, unspecified: Secondary | ICD-10-CM

## 2023-06-10 DIAGNOSIS — I5042 Chronic combined systolic (congestive) and diastolic (congestive) heart failure: Secondary | ICD-10-CM | POA: Diagnosis present

## 2023-06-10 DIAGNOSIS — I10 Essential (primary) hypertension: Secondary | ICD-10-CM | POA: Diagnosis present

## 2023-06-10 DIAGNOSIS — R932 Abnormal findings on diagnostic imaging of liver and biliary tract: Secondary | ICD-10-CM

## 2023-06-10 DIAGNOSIS — Z7982 Long term (current) use of aspirin: Secondary | ICD-10-CM

## 2023-06-10 DIAGNOSIS — I420 Dilated cardiomyopathy: Secondary | ICD-10-CM | POA: Diagnosis present

## 2023-06-10 DIAGNOSIS — Z803 Family history of malignant neoplasm of breast: Secondary | ICD-10-CM

## 2023-06-10 DIAGNOSIS — Z79899 Other long term (current) drug therapy: Secondary | ICD-10-CM

## 2023-06-10 DIAGNOSIS — Z833 Family history of diabetes mellitus: Secondary | ICD-10-CM

## 2023-06-10 DIAGNOSIS — K449 Diaphragmatic hernia without obstruction or gangrene: Secondary | ICD-10-CM | POA: Diagnosis present

## 2023-06-10 DIAGNOSIS — Z720 Tobacco use: Secondary | ICD-10-CM | POA: Diagnosis present

## 2023-06-10 DIAGNOSIS — K2900 Acute gastritis without bleeding: Secondary | ICD-10-CM | POA: Diagnosis present

## 2023-06-10 DIAGNOSIS — G4733 Obstructive sleep apnea (adult) (pediatric): Secondary | ICD-10-CM

## 2023-06-10 DIAGNOSIS — R1084 Generalized abdominal pain: Principal | ICD-10-CM

## 2023-06-10 DIAGNOSIS — E669 Obesity, unspecified: Secondary | ICD-10-CM | POA: Diagnosis present

## 2023-06-10 DIAGNOSIS — Z90710 Acquired absence of both cervix and uterus: Secondary | ICD-10-CM

## 2023-06-10 DIAGNOSIS — I700 Atherosclerosis of aorta: Secondary | ICD-10-CM | POA: Diagnosis present

## 2023-06-10 DIAGNOSIS — R748 Abnormal levels of other serum enzymes: Secondary | ICD-10-CM | POA: Diagnosis present

## 2023-06-10 DIAGNOSIS — K828 Other specified diseases of gallbladder: Secondary | ICD-10-CM | POA: Diagnosis present

## 2023-06-10 DIAGNOSIS — Z6833 Body mass index (BMI) 33.0-33.9, adult: Secondary | ICD-10-CM

## 2023-06-10 DIAGNOSIS — E079 Disorder of thyroid, unspecified: Secondary | ICD-10-CM | POA: Insufficient documentation

## 2023-06-10 DIAGNOSIS — Z79890 Hormone replacement therapy (postmenopausal): Secondary | ICD-10-CM

## 2023-06-10 DIAGNOSIS — K859 Acute pancreatitis without necrosis or infection, unspecified: Secondary | ICD-10-CM | POA: Diagnosis present

## 2023-06-10 DIAGNOSIS — E039 Hypothyroidism, unspecified: Secondary | ICD-10-CM | POA: Diagnosis present

## 2023-06-10 LAB — CBC
HCT: 49 % — ABNORMAL HIGH (ref 36.0–46.0)
Hemoglobin: 16.2 g/dL — ABNORMAL HIGH (ref 12.0–15.0)
MCH: 29.3 pg (ref 26.0–34.0)
MCHC: 33.1 g/dL (ref 30.0–36.0)
MCV: 88.6 fL (ref 80.0–100.0)
Platelets: 222 10*3/uL (ref 150–400)
RBC: 5.53 MIL/uL — ABNORMAL HIGH (ref 3.87–5.11)
RDW: 14.2 % (ref 11.5–15.5)
WBC: 7.9 10*3/uL (ref 4.0–10.5)
nRBC: 0 % (ref 0.0–0.2)

## 2023-06-10 LAB — COMPREHENSIVE METABOLIC PANEL
ALT: 28 U/L (ref 0–44)
AST: 32 U/L (ref 15–41)
Albumin: 3.8 g/dL (ref 3.5–5.0)
Alkaline Phosphatase: 91 U/L (ref 38–126)
Anion gap: 11 (ref 5–15)
BUN: 11 mg/dL (ref 8–23)
CO2: 24 mmol/L (ref 22–32)
Calcium: 8.7 mg/dL — ABNORMAL LOW (ref 8.9–10.3)
Chloride: 100 mmol/L (ref 98–111)
Creatinine, Ser: 0.99 mg/dL (ref 0.44–1.00)
GFR, Estimated: >60 mL/min (ref >60)
Glucose, Bld: 111 mg/dL — ABNORMAL HIGH (ref 70–99)
Potassium: 3.2 mmol/L — ABNORMAL LOW (ref 3.5–5.1)
Sodium: 135 mmol/L (ref 135–145)
Total Bilirubin: 1.2 mg/dL (ref 0.3–1.2)
Total Protein: 7.6 g/dL (ref 6.5–8.1)

## 2023-06-10 LAB — LIPASE, BLOOD: Lipase: 503 U/L — ABNORMAL HIGH (ref 11–51)

## 2023-06-10 MED ORDER — DICYCLOMINE HCL 10 MG PO CAPS
20.0000 mg | ORAL_CAPSULE | Freq: Once | ORAL | Status: AC
  Administered 2023-06-10: 20 mg via ORAL
  Filled 2023-06-10: qty 2

## 2023-06-10 MED ORDER — FAMOTIDINE IN NACL 20-0.9 MG/50ML-% IV SOLN
20.0000 mg | Freq: Once | INTRAVENOUS | Status: AC
  Administered 2023-06-10: 20 mg via INTRAVENOUS
  Filled 2023-06-10: qty 50

## 2023-06-10 MED ORDER — ONDANSETRON HCL 4 MG/2ML IJ SOLN
4.0000 mg | Freq: Once | INTRAMUSCULAR | Status: AC
  Administered 2023-06-10: 4 mg via INTRAVENOUS
  Filled 2023-06-10: qty 2

## 2023-06-10 MED ORDER — LACTATED RINGERS IV BOLUS
1000.0000 mL | Freq: Once | INTRAVENOUS | Status: AC
  Administered 2023-06-10: 1000 mL via INTRAVENOUS

## 2023-06-10 MED ORDER — IOHEXOL 300 MG/ML  SOLN
100.0000 mL | Freq: Once | INTRAMUSCULAR | Status: AC | PRN
  Administered 2023-06-10: 100 mL via INTRAVENOUS

## 2023-06-10 MED ORDER — POTASSIUM CHLORIDE CRYS ER 20 MEQ PO TBCR
40.0000 meq | EXTENDED_RELEASE_TABLET | Freq: Once | ORAL | Status: AC
  Administered 2023-06-10: 40 meq via ORAL
  Filled 2023-06-10: qty 2

## 2023-06-10 NOTE — ED Triage Notes (Signed)
Pt states generalized abdominal pain since yesterday  Not eating since Sunday  Been Nauseated and had vomiting as well Not tolerating fluids at this time

## 2023-06-10 NOTE — ED Provider Notes (Signed)
[facility] [employer] EMERGENCY DEPARTMENT AT [facility] [place] Provider Note   CSN: [id number] Arrival date & time: 06/10/23  2028     History  Chief Complaint  Patient presents with   Abdominal Pain    Kelly Frank is a 62 y.o. female.   Abdominal Pain Patient is a 62 year old female with past medical history significant for thyroid disease on levothyroxine, heart failure on carvedilol, Entresto, Lasix  She presents emergency room today with abdominal pain that she describes as generalized and has been constant states that it began Monday midday while she was driving home and has been persistent since.  She has had 5-6 episodes of nonbloody nonbilious emesis.  She has not been able to drink more than small amounts of liquid because of her nausea.  She denies any chest pain or difficulty breathing.  She states she has had no abdominal surgeries in the past she drinks 1 to 2 glasses of wine per month.  She states her feces have been normal no blood in her stool or diarrhea.  She denies any abdominal pain currently but states that she did have quite significant pain yesterday and that she has some achy pain earlier this morning.  No fevers at home, no urinary frequency urgency dysuria or hematuria.  No other associated symptoms.     Home Medications Prior to Admission medications   Medication Sig Start Date End Date Taking? Authorizing Provider  aspirin EC 81 MG tablet Take 81 mg by mouth daily.    [provider]  baclofen (LIORESAL) 10 MG tablet Take 10 mg by mouth 3 (three) times daily as needed for muscle spasms. Patient not taking: Reported on 11/20/2020    [provider]  carvedilol (COREG) 6.25 MG tablet Take 6.25 mg by mouth 2 (two) times daily with a meal.    [provider]  ENTRESTO 24-26 MG Take 1 tablet by mouth 2 (two) times daily.  06/19/17   [provider]  furosemide (LASIX) 20 MG tablet Take 20 mg by mouth daily. 03/12/17   [provider]  levothyroxine (SYNTHROID, LEVOTHROID) 200 MCG tablet Take 200 mcg by mouth daily. 03/20/17   [provider]      Allergies    Patient has no known allergies.    Review of Systems   Review of Systems  Gastrointestinal:  Positive for abdominal pain.    Physical Exam Updated Vital Signs BP (!) 156/102 (BP Location: Right Arm)   Pulse 72   Temp 98 F (36.7 C)   Resp 18   Ht 5\' 7"  (1.702 m)   Wt 95.7 kg   SpO2 98%   BMI 33.05 kg/m  Physical Exam Vitals and nursing note reviewed.  Constitutional:      General: She is not in acute distress.    Comments: Pleasant well-appearing 62 year old.  In no acute distress.  Sitting comfortably in bed.  Able answer questions appropriately follow commands. No increased work of breathing. Speaking in full sentences.   HENT:     Head: Normocephalic and atraumatic.     Nose: Nose normal.     Mouth/Throat:     Mouth: Mucous membranes are dry.  Eyes:     General: No scleral icterus. Cardiovascular:     Rate and Rhythm: Normal rate and regular rhythm.     Pulses: Normal pulses.     Heart sounds: Normal heart sounds.  Pulmonary:     Effort: Pulmonary effort is normal. No respiratory  distress.     Breath sounds: No wheezing.  Abdominal:     Palpations: Abdomen is soft.     Tenderness: There is abdominal tenderness. There is no guarding or rebound.     Comments: Very mild diffuse abdominal tenderness no guarding or rebound.  No bruising  Musculoskeletal:     Cervical back: Normal range of motion.     Right lower leg: No edema.     Left lower leg: No edema.  Skin:    General: Skin is warm and dry.     Capillary Refill: Capillary refill takes less than 2 seconds.  Neurological:     Mental Status: She is alert. Mental status is at baseline.  Psychiatric:        Mood and Affect: Mood normal.        Behavior: Behavior normal.     ED Results / Procedures / Treatments   Labs (all labs ordered are listed, but only  abnormal results are displayed) Labs Reviewed  LIPASE, BLOOD - Abnormal; Notable for the following components:      Result Value   Lipase 503 (*)    All other components within normal limits  COMPREHENSIVE METABOLIC PANEL - Abnormal; Notable for the following components:   Potassium 3.2 (*)    Glucose, Bld 111 (*)    Calcium 8.7 (*)    All other components within normal limits  CBC - Abnormal; Notable for the following components:   RBC 5.53 (*)    Hemoglobin 16.2 (*)    HCT 49.0 (*)    All other components within normal limits  URINALYSIS, ROUTINE W REFLEX MICROSCOPIC    EKG None  Radiology No results found.  Procedures Procedures    Medications Ordered in ED Medications  famotidine (PEPCID) IVPB 20 mg premix (20 mg Intravenous New Bag/Given 06/10/23 2336)  lactated ringers bolus 1,000 mL (1,000 mLs Intravenous New Bag/Given 06/10/23 2335)  potassium chloride SA (KLOR-CON M) CR tablet 40 mEq (40 mEq Oral Given 06/10/23 2338)  ondansetron (ZOFRAN) injection 4 mg (4 mg Intravenous Given 06/10/23 2336)  dicyclomine (BENTYL) capsule 20 mg (20 mg Oral Given 06/10/23 2338)  iohexol (OMNIPAQUE) 300 MG/ML solution 100 mL (100 mLs Intravenous Contrast Given 06/10/23 2321)    ED Course/ Medical Decision Making/ A&P Clinical Course as of 06/10/23 2348  Tue Jun 10, 2023  2307 Pain since Monday.  States abd pain began yesterday while driving.   5-6 episodes of NBNB vomiting.  She's been unable to tolerate PO.   No abd surgeries.  1-2 drinks of wine a month.   Poop nml, no blood or diarrhea.  [WF]    Clinical Course User Index [WF] Gailen Shelter, [place]                             Medical Decision Making Amount and/or Complexity of Data Reviewed Labs: ordered. Radiology: ordered.  Risk Prescription drug management.   This patient presents to the ED for concern of abd pain, this involves a number of treatment options, and is a complaint that carries with it a moderate  risk of complications and morbidity. A differential diagnosis was considered for the patient's symptoms which is discussed below:   The causes of generalized abdominal pain include but are not limited to AAA, mesenteric ischemia, appendicitis, diverticulitis, DKA, gastritis, gastroenteritis, AMI, nephrolithiasis, pancreatitis, peritonitis, adrenal insufficiency,lead poisoning, iron toxicity, intestinal ischemia, constipation, UTI,SBO/LBO, splenic rupture, biliary disease,  IBD, IBS, PUD, or hepatitis. Ectopic pregnancy, ovarian torsion, PID.    Co morbidities: Discussed in HPI   Brief History:  Patient is a 62 year old female with past medical history significant for thyroid disease on levothyroxine, heart failure on carvedilol, Entresto, Lasix  She presents emergency room today with abdominal pain that she describes as generalized and has been constant states that it began Monday midday while she was driving home and has been persistent since.  She has had 5-6 episodes of nonbloody nonbilious emesis.  She has not been able to drink more than small amounts of liquid because of her nausea.  She denies any chest pain or difficulty breathing.  She states she has had no abdominal surgeries in the past she drinks 1 to 2 glasses of wine per month.  She states her feces have been normal no blood in her stool or diarrhea.  She denies any abdominal pain currently but states that she did have quite significant pain yesterday and that she has some achy pain earlier this morning.  No fevers at home, no urinary frequency urgency dysuria or hematuria.  No other associated symptoms.    EMR reviewed including pt PMHx, past surgical history and past visits to ER.   See HPI for more details   Lab Tests:   Lab work overall unremarkable apart from lipase elevated at 503   Imaging Studies:  Pending at time of signout    Cardiac Monitoring:  The patient was maintained on a cardiac monitor.  I  personally viewed and interpreted the cardiac monitored which showed an underlying rhythm of: NSR NA   Medicines ordered:  I ordered medication including Bentyl, potassium, Zofran, Pepcid, lactated Ringer's 500 mL for abdominal pain Reevaluation of the patient after these medicines showed that the patient improved I have reviewed the patients home medicines and have made adjustments as needed   Critical Interventions:     Consults/Attending Physician    discussed this case with my attending physician Dr. Read Drivers who assumed care at midnight   Reevaluation:  After the interventions noted above I re-evaluated patient and found that they have :improved   Social Determinants of Health:      Problem List / ED Course:  Patient with epigastric abdominal pain and some generalized abdominal pain was worse yesterday but seems to have significantly improved.  Seems that she has not had much imaging of her abdomen in the past.  Will obtain CT imaging concerning for pancreatitis versus pancreatic masses versus cyst versus peptic ulcer disease with factitious elevation in lipase.   Dispostion:  Pending at time of shift change   Final Clinical Impression(s) / ED Diagnoses Final diagnoses:  Generalized abdominal pain  Nausea and vomiting, unspecified vomiting type    Rx / DC Orders ED Discharge Orders     None         Gailen Shelter, [place] 06/10/23 2359    Paula Libra, MD 06/11/23 [phone]

## 2023-06-11 ENCOUNTER — Encounter (HOSPITAL_BASED_OUTPATIENT_CLINIC_OR_DEPARTMENT_OTHER): Payer: Self-pay | Admitting: Internal Medicine

## 2023-06-11 ENCOUNTER — Inpatient Hospital Stay (HOSPITAL_COMMUNITY): Payer: Federal, State, Local not specified - PPO

## 2023-06-11 ENCOUNTER — Emergency Department (HOSPITAL_BASED_OUTPATIENT_CLINIC_OR_DEPARTMENT_OTHER): Payer: Federal, State, Local not specified - PPO

## 2023-06-11 DIAGNOSIS — K449 Diaphragmatic hernia without obstruction or gangrene: Secondary | ICD-10-CM | POA: Diagnosis present

## 2023-06-11 DIAGNOSIS — Z90710 Acquired absence of both cervix and uterus: Secondary | ICD-10-CM | POA: Diagnosis not present

## 2023-06-11 DIAGNOSIS — Z79899 Other long term (current) drug therapy: Secondary | ICD-10-CM | POA: Diagnosis not present

## 2023-06-11 DIAGNOSIS — Z79890 Hormone replacement therapy (postmenopausal): Secondary | ICD-10-CM | POA: Diagnosis not present

## 2023-06-11 DIAGNOSIS — K810 Acute cholecystitis: Secondary | ICD-10-CM | POA: Diagnosis present

## 2023-06-11 DIAGNOSIS — K838 Other specified diseases of biliary tract: Secondary | ICD-10-CM | POA: Diagnosis present

## 2023-06-11 DIAGNOSIS — K859 Acute pancreatitis without necrosis or infection, unspecified: Secondary | ICD-10-CM | POA: Diagnosis present

## 2023-06-11 DIAGNOSIS — R1084 Generalized abdominal pain: Secondary | ICD-10-CM | POA: Diagnosis not present

## 2023-06-11 DIAGNOSIS — Z833 Family history of diabetes mellitus: Secondary | ICD-10-CM | POA: Diagnosis not present

## 2023-06-11 DIAGNOSIS — K828 Other specified diseases of gallbladder: Secondary | ICD-10-CM | POA: Diagnosis present

## 2023-06-11 DIAGNOSIS — E876 Hypokalemia: Secondary | ICD-10-CM | POA: Diagnosis present

## 2023-06-11 DIAGNOSIS — F17210 Nicotine dependence, cigarettes, uncomplicated: Secondary | ICD-10-CM | POA: Diagnosis present

## 2023-06-11 DIAGNOSIS — G4733 Obstructive sleep apnea (adult) (pediatric): Secondary | ICD-10-CM | POA: Diagnosis present

## 2023-06-11 DIAGNOSIS — I700 Atherosclerosis of aorta: Secondary | ICD-10-CM | POA: Diagnosis present

## 2023-06-11 DIAGNOSIS — Z803 Family history of malignant neoplasm of breast: Secondary | ICD-10-CM | POA: Diagnosis not present

## 2023-06-11 DIAGNOSIS — I110 Hypertensive heart disease with heart failure: Secondary | ICD-10-CM | POA: Diagnosis present

## 2023-06-11 DIAGNOSIS — E039 Hypothyroidism, unspecified: Secondary | ICD-10-CM | POA: Diagnosis present

## 2023-06-11 DIAGNOSIS — I5042 Chronic combined systolic (congestive) and diastolic (congestive) heart failure: Secondary | ICD-10-CM | POA: Diagnosis present

## 2023-06-11 DIAGNOSIS — Z6833 Body mass index (BMI) 33.0-33.9, adult: Secondary | ICD-10-CM | POA: Diagnosis not present

## 2023-06-11 DIAGNOSIS — K2900 Acute gastritis without bleeding: Secondary | ICD-10-CM | POA: Diagnosis present

## 2023-06-11 DIAGNOSIS — R932 Abnormal findings on diagnostic imaging of liver and biliary tract: Secondary | ICD-10-CM | POA: Diagnosis not present

## 2023-06-11 DIAGNOSIS — E669 Obesity, unspecified: Secondary | ICD-10-CM | POA: Diagnosis present

## 2023-06-11 DIAGNOSIS — Z7982 Long term (current) use of aspirin: Secondary | ICD-10-CM | POA: Diagnosis not present

## 2023-06-11 DIAGNOSIS — Z8249 Family history of ischemic heart disease and other diseases of the circulatory system: Secondary | ICD-10-CM | POA: Diagnosis not present

## 2023-06-11 DIAGNOSIS — I420 Dilated cardiomyopathy: Secondary | ICD-10-CM | POA: Diagnosis present

## 2023-06-11 DIAGNOSIS — R748 Abnormal levels of other serum enzymes: Secondary | ICD-10-CM | POA: Diagnosis present

## 2023-06-11 LAB — COMPREHENSIVE METABOLIC PANEL
ALT: 23 U/L (ref 0–44)
AST: 24 U/L (ref 15–41)
Albumin: 3.3 g/dL — ABNORMAL LOW (ref 3.5–5.0)
Alkaline Phosphatase: 73 U/L (ref 38–126)
Anion gap: 10 (ref 5–15)
BUN: 13 mg/dL (ref 8–23)
CO2: 24 mmol/L (ref 22–32)
Calcium: 8.8 mg/dL — ABNORMAL LOW (ref 8.9–10.3)
Chloride: 103 mmol/L (ref 98–111)
Creatinine, Ser: 1 mg/dL (ref 0.44–1.00)
GFR, Estimated: >60 mL/min (ref >60)
Glucose, Bld: 88 mg/dL (ref 70–99)
Potassium: 3.9 mmol/L (ref 3.5–5.1)
Sodium: 137 mmol/L (ref 135–145)
Total Bilirubin: 1.1 mg/dL (ref 0.3–1.2)
Total Protein: 6.7 g/dL (ref 6.5–8.1)

## 2023-06-11 LAB — CBC WITH DIFFERENTIAL/PLATELET
Abs Immature Granulocytes: 0.02 10*3/uL (ref 0.00–0.07)
Basophils Absolute: 0 10*3/uL (ref 0.0–0.1)
Basophils Relative: 0 %
Eosinophils Absolute: 0.1 10*3/uL (ref 0.0–0.5)
Eosinophils Relative: 1 %
HCT: 45.8 % (ref 36.0–46.0)
Hemoglobin: 14.9 g/dL (ref 12.0–15.0)
Immature Granulocytes: 0 %
Lymphocytes Relative: 28 %
Lymphs Abs: 1.9 10*3/uL (ref 0.7–4.0)
MCH: 29.6 pg (ref 26.0–34.0)
MCHC: 32.5 g/dL (ref 30.0–36.0)
MCV: 91.1 fL (ref 80.0–100.0)
Monocytes Absolute: 0.6 10*3/uL (ref 0.1–1.0)
Monocytes Relative: 8 %
Neutro Abs: 4.4 10*3/uL (ref 1.7–7.7)
Neutrophils Relative %: 63 %
Platelets: 188 10*3/uL (ref 150–400)
RBC: 5.03 MIL/uL (ref 3.87–5.11)
RDW: 14.6 % (ref 11.5–15.5)
WBC: 6.9 10*3/uL (ref 4.0–10.5)
nRBC: 0 % (ref 0.0–0.2)

## 2023-06-11 LAB — URINALYSIS, ROUTINE W REFLEX MICROSCOPIC
Glucose, UA: NEGATIVE mg/dL
Hgb urine dipstick: NEGATIVE
Ketones, ur: 15 mg/dL — AB
Leukocytes,Ua: NEGATIVE
Nitrite: NEGATIVE
Protein, ur: NEGATIVE mg/dL
Specific Gravity, Urine: 1.02 (ref 1.005–1.030)
pH: 5.5 (ref 5.0–8.0)

## 2023-06-11 LAB — MAGNESIUM: Magnesium: 1.9 mg/dL (ref 1.7–2.4)

## 2023-06-11 LAB — PHOSPHORUS: Phosphorus: 3.7 mg/dL (ref 2.5–4.6)

## 2023-06-11 MED ORDER — POTASSIUM CHLORIDE IN NACL 20-0.9 MEQ/L-% IV SOLN
INTRAVENOUS | Status: DC
  Filled 2023-06-11: qty 1000

## 2023-06-11 MED ORDER — METRONIDAZOLE 500 MG/100ML IV SOLN
500.0000 mg | Freq: Two times a day (BID) | INTRAVENOUS | Status: DC
  Administered 2023-06-11 – 2023-06-13 (×4): 500 mg via INTRAVENOUS
  Filled 2023-06-11 (×4): qty 100

## 2023-06-11 MED ORDER — CARVEDILOL 25 MG PO TABS
25.0000 mg | ORAL_TABLET | Freq: Two times a day (BID) | ORAL | Status: DC
  Administered 2023-06-11 – 2023-06-13 (×4): 25 mg via ORAL
  Filled 2023-06-11 (×4): qty 1

## 2023-06-11 MED ORDER — GADOBUTROL 1 MMOL/ML IV SOLN
10.0000 mL | Freq: Once | INTRAVENOUS | Status: AC | PRN
  Administered 2023-06-11: 10 mL via INTRAVENOUS

## 2023-06-11 MED ORDER — LEVOTHYROXINE SODIUM 25 MCG PO TABS
175.0000 ug | ORAL_TABLET | Freq: Every day | ORAL | Status: DC
  Administered 2023-06-12 – 2023-06-13 (×2): 175 ug via ORAL
  Filled 2023-06-11 (×3): qty 1

## 2023-06-11 MED ORDER — ASPIRIN 81 MG PO TBEC
81.0000 mg | DELAYED_RELEASE_TABLET | Freq: Every day | ORAL | Status: DC
  Administered 2023-06-11 – 2023-06-13 (×3): 81 mg via ORAL
  Filled 2023-06-11 (×3): qty 1

## 2023-06-11 MED ORDER — ONDANSETRON HCL 4 MG PO TABS: 4.0000 mg | ORAL_TABLET | Freq: Four times a day (QID) | ORAL | Status: DC | PRN

## 2023-06-11 MED ORDER — NICOTINE 14 MG/24HR TD PT24
14.0000 mg | MEDICATED_PATCH | Freq: Every day | TRANSDERMAL | Status: DC | PRN
  Filled 2023-06-11: qty 1

## 2023-06-11 MED ORDER — ONDANSETRON HCL 4 MG/2ML IJ SOLN: 4.0000 mg | Freq: Four times a day (QID) | INTRAMUSCULAR | Status: DC | PRN

## 2023-06-11 MED ORDER — SODIUM CHLORIDE 0.9 % IV SOLN
2.0000 g | INTRAVENOUS | Status: DC
  Administered 2023-06-11 – 2023-06-12 (×2): 2 g via INTRAVENOUS
  Filled 2023-06-11 (×2): qty 20

## 2023-06-11 MED ORDER — ACETAMINOPHEN 325 MG PO TABS: 650.0000 mg | ORAL_TABLET | Freq: Four times a day (QID) | ORAL | Status: DC | PRN

## 2023-06-11 MED ORDER — ENOXAPARIN SODIUM 40 MG/0.4ML IJ SOSY
40.0000 mg | PREFILLED_SYRINGE | INTRAMUSCULAR | Status: DC
  Administered 2023-06-11 – 2023-06-12 (×2): 40 mg via SUBCUTANEOUS
  Filled 2023-06-11 (×2): qty 0.4

## 2023-06-11 MED ORDER — SACUBITRIL-VALSARTAN 97-103 MG PO TABS
1.0000 | ORAL_TABLET | Freq: Two times a day (BID) | ORAL | Status: DC
  Administered 2023-06-11 – 2023-06-13 (×5): 1 via ORAL
  Filled 2023-06-11 (×5): qty 1

## 2023-06-11 MED ORDER — FUROSEMIDE 20 MG PO TABS: 20.0000 mg | ORAL_TABLET | ORAL | Status: DC | PRN

## 2023-06-11 MED ORDER — ACETAMINOPHEN 650 MG RE SUPP: 650.0000 mg | Freq: Four times a day (QID) | RECTAL | Status: DC | PRN

## 2023-06-11 NOTE — Consult Note (Signed)
Reason for Consult: Abdominal pain abnormal x-ray Referring Physician: Hospital team  Kelly Frank is an 62 y.o. female.  HPI: Patient is actually feeling better and has an essentially GI negative history and she says her pain came on her all of a sudden and she has not been told she had a gallbladder problem before and no GI problems run in the family and her lipase was elevated and her CT showed a dilated gallbladder and CBD although the pancreas seems to look fine and there is a question of an ampullary process we are consulted for further workup and plans  Past Medical History:  Diagnosis Date   Dilated cardiomyopathy (HCC) 06/23/2017   DOE (dyspnea on exertion)    Essential hypertension 04/29/2017   Hypertension    OSA on CPAP 06/23/2017   Rotator cuff impingement syndrome 12/12/2011   Thyroid disease    Tobacco abuse 04/29/2017    Past Surgical History:  Procedure Laterality Date   ABDOMINAL HYSTERECTOMY     SHOULDER SURGERY  2000   left reconstructive    SHOULDER SURGERY Left     Family History  Problem Relation Age of Onset   Diabetes Mother    Hypertension Mother    Breast cancer Sister 71   Heart attack Neg Hx    Hyperlipidemia Neg Hx    Sudden death Neg Hx     Social History:  reports that she has been smoking cigarettes. She has a 12.50 pack-year smoking history. She has never used smokeless tobacco. She reports current alcohol use. She reports that she does not use drugs.  Allergies: No Known Allergies  Medications: I have reviewed the patient's current medications.  Results for orders placed or performed during the hospital encounter of 06/10/23 (from the past 48 hour(s))  Urinalysis, Routine w reflex microscopic -Urine, Clean Catch     Status: Abnormal   Collection Time: 06/10/23  8:18 AM  Result Value Ref Range   Color, Urine YELLOW YELLOW   APPearance CLEAR CLEAR   Specific Gravity, Urine 1.020 1.005 - 1.030   pH 5.5 5.0 - 8.0   Glucose, UA NEGATIVE  NEGATIVE mg/dL   Hgb urine dipstick NEGATIVE NEGATIVE   Bilirubin Urine SMALL (A) NEGATIVE   Ketones, ur 15 (A) NEGATIVE mg/dL   Protein, ur NEGATIVE NEGATIVE mg/dL   Nitrite NEGATIVE NEGATIVE   Leukocytes,Ua NEGATIVE NEGATIVE    Comment: Microscopic not done on urines with negative protein, blood, leukocytes, nitrite, or glucose < 500 mg/dL. Performed at [facility], [address] [place], [place] [address]   Lipase, blood     Status: Abnormal   Collection Time: 06/10/23  8:39 PM  Result Value Ref Range   Lipase 503 (H) 11 - 51 U/L    Comment: RESULTS CONFIRMED BY MANUAL DILUTION Performed at [facility], [address] [place], [place] [address]   Comprehensive metabolic panel     Status: Abnormal   Collection Time: 06/10/23  8:39 PM  Result Value Ref Range   Sodium 135 135 - 145 mmol/L   Potassium 3.2 (L) 3.5 - 5.1 mmol/L   Chloride 100 98 - 111 mmol/L   CO2 24 22 - 32 mmol/L   Glucose, Bld 111 (H) 70 - 99 mg/dL    Comment: Glucose reference range applies only to samples taken after fasting for at least 8 hours.   BUN 11 8 - 23 mg/dL   Creatinine, Ser 6.57 0.44 - 1.00  mg/dL   Calcium 8.7 (L) 8.9 - 10.3 mg/dL   Total Protein 7.6 6.5 - 8.1 g/dL   Albumin 3.8 3.5 - 5.0 g/dL   AST 32 15 - 41 U/L   ALT 28 0 - 44 U/L   Alkaline Phosphatase 91 38 - 126 U/L   Total Bilirubin 1.2 0.3 - 1.2 mg/dL   GFR, Estimated >91 >47 mL/min    Comment: (NOTE) Calculated using the CKD-EPI Creatinine Equation (2021)    Anion gap 11 5 - 15    Comment: Performed at [facility], [address] [place], [place] [address]  CBC     Status: Abnormal   Collection Time: 06/10/23  8:39 PM  Result Value Ref Range   WBC 7.9 4.0 - 10.5 K/uL   RBC 5.53 (H) 3.87 - 5.11 MIL/uL   Hemoglobin 16.2 (H) 12.0 - 15.0 g/dL   HCT 21.3 (H) 08.6 - 57.8 %   MCV 88.6 80.0 - 100.0 fL   MCH 29.3 26.0 - 34.0 pg   MCHC 33.1 30.0 - 36.0 g/dL   RDW 46.9 62.9 - 52.8 %   Platelets  222 150 - 400 K/uL   nRBC 0.0 0.0 - 0.2 %    Comment: Performed at [facility], [address] [place], [place] [address]  CBC with Differential/Platelet     Status: None   Collection Time: 06/11/23 10:45 AM  Result Value Ref Range   WBC 6.9 4.0 - 10.5 K/uL   RBC 5.03 3.87 - 5.11 MIL/uL   Hemoglobin 14.9 12.0 - 15.0 g/dL   HCT 40.1 02.7 - 25.3 %   MCV 91.1 80.0 - 100.0 fL   MCH 29.6 26.0 - 34.0 pg   MCHC 32.5 30.0 - 36.0 g/dL   RDW 66.4 40.3 - 47.4 %   Platelets 188 150 - 400 K/uL   nRBC 0.0 0.0 - 0.2 %   Neutrophils Relative % 63 %   Neutro Abs 4.4 1.7 - 7.7 K/uL   Lymphocytes Relative 28 %   Lymphs Abs 1.9 0.7 - 4.0 K/uL   Monocytes Relative 8 %   Monocytes Absolute 0.6 0.1 - 1.0 K/uL   Eosinophils Relative 1 %   Eosinophils Absolute 0.1 0.0 - 0.5 K/uL   Basophils Relative 0 %   Basophils Absolute 0.0 0.0 - 0.1 K/uL   Immature Granulocytes 0 %   Abs Immature Granulocytes 0.02 0.00 - 0.07 K/uL    Comment: Performed at [facility], [address], [place], [place] [address]  Comprehensive metabolic panel     Status: Abnormal   Collection Time: 06/11/23 10:45 AM  Result Value Ref Range   Sodium 137 135 - 145 mmol/L   Potassium 3.9 3.5 - 5.1 mmol/L   Chloride 103 98 - 111 mmol/L   CO2 24 22 - 32 mmol/L   Glucose, Bld 88 70 - 99 mg/dL    Comment: Glucose reference range applies only to samples taken after fasting for at least 8 hours.   BUN 13 8 - 23 mg/dL   Creatinine, Ser 3.87 0.44 - 1.00 mg/dL   Calcium 8.8 (L) 8.9 - 10.3 mg/dL   Total Protein 6.7 6.5 - 8.1 g/dL   Albumin 3.3 (L) 3.5 - 5.0 g/dL   AST 24 15 - 41 U/L   ALT 23 0 - 44 U/L   Alkaline Phosphatase 73 38 - 126 U/L   Total Bilirubin 1.1 0.3 - 1.2  mg/dL   GFR, Estimated >16 >10 mL/min    Comment: (NOTE) Calculated using the CKD-EPI Creatinine Equation (2021)    Anion gap 10 5 - 15    Comment: Performed at [facility], [address] [place], [place]  [address]  Magnesium     Status: None   Collection Time: 06/11/23 10:45 AM  Result Value Ref Range   Magnesium 1.9 1.7 - 2.4 mg/dL    Comment: Performed at [facility], [address] [place], [place] [address]  Phosphorus     Status: None   Collection Time: 06/11/23 10:45 AM  Result Value Ref Range   Phosphorus 3.7 2.5 - 4.6 mg/dL    Comment: Performed at [facility], [address] [place], [place] [address]    US Abdomen Limited RUQ (LIVER/GB)  Result Date: 06/11/2023 CLINICAL DATA:  Abnormal CT. EXAM: ULTRASOUND ABDOMEN LIMITED RIGHT UPPER QUADRANT COMPARISON:  CT with IV contrast yesterday, showing gallbladder thickening and pericholecystic fluid with a prominent common bile duct with questionable ampullary region mass. FINDINGS: Gallbladder: Portions of the gallbladder free wall are prominent measuring up to 4 mm in thickness and there is a small amount of pericholecystic fluid. There was no positive sonographic Murphy's sign, but the patient had been pain medicated prior to the study. There is hypoechoic layering sludge in the gallbladder but no shadowing stones. CT and ultrasound findings are concerning for acalculous cholecystitis. Common bile duct: Diameter: Prominent measuring 11 mm, with mild intrahepatic biliary prominence. Liver: No focal lesion identified. Within normal limits in parenchymal echogenicity. Portal vein is patent on color Doppler imaging with normal direction of blood flow towards the liver. Other: None. IMPRESSION: 1. Gallbladder wall thickening and pericholecystic fluid with layering sludge in the gallbladder. Findings are concerning for acalculous cholecystitis. Sonographic Murphy's sign unable to be assessed due to the patient's pain medicated state. 2. Prominent common bile duct with mild intrahepatic biliary prominence. Etiology indeterminate. Electronically Signed   By: Almira Bar M.D.   On: 06/11/2023 02:17   CT ABDOMEN  PELVIS W CONTRAST  Result Date: 06/11/2023 CLINICAL DATA:  Abdominal pain. EXAM: CT ABDOMEN AND PELVIS WITH CONTRAST TECHNIQUE: Multidetector CT imaging of the abdomen and pelvis was performed using the standard protocol following bolus administration of intravenous contrast. RADIATION DOSE REDUCTION: This exam was performed according to the departmental dose-optimization program which includes automated exposure control, adjustment of the mA and/or kV according to patient size and/or use of iterative reconstruction technique. CONTRAST:  OMNIPAQUE IOHEXOL 300 MG/ML  SOLN COMPARISON:  None Available. FINDINGS: Lower chest: Mild lingular and bibasilar atelectasis is seen. Hepatobiliary: No focal liver abnormality is seen. No gallstones are seen within a moderately distended gallbladder. Mild pericholecystic inflammation is noted. The common bile duct measures 8.8 mm in diameter, with an 18 mm x 17 mm x 17 mm area of low attenuation seen within the region of the ampulla (axial CT image 32, CT series 3). Pancreas: Unremarkable. No pancreatic ductal dilatation or surrounding inflammatory changes. Spleen: Normal in size without focal abnormality. Adrenals/Urinary Tract: Adrenal glands are unremarkable. Kidneys are normal, without renal calculi, focal lesion, or hydronephrosis. Bladder is unremarkable. Stomach/Bowel: Stomach is within normal limits. Appendix appears normal. No evidence of bowel wall thickening, distention, or inflammatory changes. Vascular/Lymphatic: Aortic atherosclerosis. No enlarged abdominal or pelvic lymph nodes. Reproductive: Status post hysterectomy. No adnexal masses. Other: No abdominal wall hernia or abnormality. There is a small amount of posterior pelvic  free fluid. Musculoskeletal: Multilevel degenerative changes are seen within the lumbar spine, most prominent at the level of L5-S1. IMPRESSION: 1. Moderately distended gallbladder with mild pericholecystic inflammation, which may  represent sequelae associated with acalculous cholecystitis. Correlation with gallbladder ultrasound is recommended. 2. Mild dilatation of the common bile duct with an area of low attenuation seen within the region of the ampulla. Correlation with ERCP is recommended to exclude the presence of an underlying neoplastic process. 3. Small amount of posterior pelvic free fluid. 4. Evidence of prior hysterectomy. 5. Aortic atherosclerosis. Aortic Atherosclerosis (ICD10-I70.0). Electronically Signed   By: Aram Candela M.D.   On: 06/11/2023 00:01    ROS negative except above she is hungry and wants to eat Blood pressure (!) 159/77, pulse (!) 51, temperature 98.3 F (36.8 C), temperature source Oral, resp. rate 18, height 5\' 7"  (1.702 m), weight 95.7 kg, SpO2 98 %. Physical Exam vital signs stable afebrile no acute distress exam pertinent for abdomen being soft nontender labs and CT reviewed  Assessment/Plan: Abnormal CT elevated lipase Plan: Will allow clear liquids after MRI further workup and plans pending those findings  Bertina Guthridge E 06/11/2023, 3:09 PM

## 2023-06-11 NOTE — ED Notes (Signed)
Report given to [facility]. 

## 2023-06-11 NOTE — H&P (Signed)
History and Physical    Patient: Kelly Frank ZOX:[id number] DOB: 08/23/1961 DOA: 06/10/2023 DOS: the patient was seen and examined on 06/11/2023 PCP: [facility]  Patient coming from: Home  Chief Complaint:  Chief Complaint  Patient presents with   Abdominal Pain   HPI: Roxie A Klahr is a 62 y.o. female with medical history significant of hypertension, class I obesity, chronic combined systolic and diastolic heart failure, OSA on CPAP, tobacco abuse who presented to the emergency department complaints of sudden onset RUQ abdominal pain since Monday morning with very little oral intake since Sunday associated with nausea and several episodes of emesis on Monday.  No previous episodes with similar symptoms.    No diarrhea, constipation, melena or hematochezia.  No flank pain, dysuria, frequency or hematuria. No fever, chills or night sweats. No sore throat, rhinorrhea, dyspnea, wheezing or hemoptysis.  No chest pain, palpitations, diaphoresis, PND, orthopnea or pitting edema of the lower extremities. No polyuria, polydipsia, polyphagia or blurred vision.  Lab work: Her urinalysis had a small bilirubin and ketones of 15 mg/dL.  CBC showed white count of 7.9, hemoglobin 16.2 g/dL and platelets 811.  CMP showed a potassium of 3.2 mmol/L and a glucose of 111 mg/dL.  The rest of the CMP measurements were normal after calcium correction.  Imaging: CT abdomen/pelvis with contrast showing moderately distended gallbladder with mild pericholecystic inflammation, which may represent a calculus cholecystitis.  Mild dilatation of the CBD with a low-attenuation area in the ampulla.  ERCP recommended to exclude neoplasm.  Small amount of posterior pelvic free fluid.  Prior hysterectomy.  Aortic atherosclerosis.   ED course: Initial vital signs were temperature 98 F, pulse 87, respiration 18, BP 155/118 mmHg O2 sat 100% on room air.  Patient received fentanyl 20 mg p.o. x 1, famotidine 20 mg IVPB, LR  1000 mL bolus, ondansetron 4 mg IVP x 1 and KCl 40 mEq p.o. x 1.  Review of Systems: As mentioned in the history of present illness. All other systems reviewed and are negative.  Past Medical History:  Diagnosis Date   DOE (dyspnea on exertion)    Hypertension    Thyroid disease    Past Surgical History:  Procedure Laterality Date   ABDOMINAL HYSTERECTOMY     SHOULDER SURGERY  2000   left reconstructive    SHOULDER SURGERY Left    Social History:  reports that she has been smoking cigarettes. She has a 12.50 pack-year smoking history. She has never used smokeless tobacco. She reports current alcohol use. She reports that she does not use drugs.  No Known Allergies  Family History  Problem Relation Age of Onset   Diabetes Mother    Hypertension Mother    Breast cancer Sister 41   Heart attack Neg Hx    Hyperlipidemia Neg Hx    Sudden death Neg Hx     Prior to Admission medications   Medication Sig Start Date End Date Taking? Authorizing Provider  albuterol (VENTOLIN HFA) 108 (90 Base) MCG/ACT inhaler Inhale 2 puffs into the lungs as needed for wheezing or shortness of breath. 01/07/23  Yes [provider]  aspirin EC 81 MG tablet Take 81 mg by mouth daily.   Yes [provider]  carvedilol (COREG) 25 MG tablet Take 25 mg by mouth 2 (two) times daily with a meal. 12/19/18  Yes [provider]  ENTRESTO 97-103 MG Take 1 tablet by mouth 2 (two) times daily. 03/07/19  Yes [provider]  furosemide (LASIX) 20 MG tablet Take 20 mg by mouth as needed for fluid or edema. 03/12/17  Yes [provider]  levothyroxine (SYNTHROID) 175 MCG tablet Take 175 mcg by mouth daily. 05/25/23  Yes [provider]    Physical Exam: Vitals:   06/11/23 0815 06/11/23 0823 06/11/23 0825 06/11/23 0941  BP:  123/73  (!) 147/77  Pulse:  80 (!) 54 (!) 51  Resp:  19 14 16   Temp: 97.9 F (36.6 C)   98.2 F (36.8 C)  TempSrc:    Oral  SpO2:  99% 98%  98%  Weight:      Height:       Physical Exam Vitals and nursing note reviewed.  Constitutional:      General: She is awake. She is not in acute distress.    Appearance: She is well-developed. She is obese.  HENT:     Head: Normocephalic.     Nose: No rhinorrhea.     Mouth/Throat:     Mouth: Mucous membranes are dry.  Eyes:     General: No scleral icterus.    Pupils: Pupils are equal, round, and reactive to light.  Neck:     Vascular: No JVD.  Cardiovascular:     Rate and Rhythm: Normal rate and regular rhythm.     Heart sounds: S1 normal and S2 normal.  Pulmonary:     Effort: Pulmonary effort is normal.     Breath sounds: Normal breath sounds.  Abdominal:     Palpations: Abdomen is soft.     Tenderness: There is abdominal tenderness in the right upper quadrant.  Musculoskeletal:     Cervical back: Neck supple.     Right lower leg: No edema.     Left lower leg: No edema.  Skin:    General: Skin is warm and dry.  Neurological:     General: No focal deficit present.     Mental Status: She is alert and oriented to person, place, and time.  Psychiatric:        Mood and Affect: Mood normal.        Behavior: Behavior normal. Behavior is cooperative.     Data Reviewed:  There are no new results to review at this time.  Assessment and Plan: Principal Problem:   Acute acalculous cholecystitis Associated with:   Elevated lipase/abnormal imaging Telemetry/inpatient. Continue gentle/time-limited IV fluids. Clear liquid diet. N.p.o. after midnight. Analgesics as needed. Antiemetics as needed. Pantoprazole 40 mg IVP daily. Follow CBC, CMP and lipase in AM.  Active Problems:   Chronic combined systolic and diastolic congestive heart failure (HCC) Secondary to history of:   Dilated cardiomyopathy (HCC) Continue carvedilol 25 mg p.o. twice daily. Continue Entresto 97-103 mg p.o. twice daily. Continue furosemide as needed. Check echocardiogram.    Tobacco  abuse Declined nicotine replacement therapy. Tobacco cessation advised.    Essential hypertension On carvedilol and Entresto as above. Monitor BP, HR, renal function and electrolytes.    OSA on CPAP Continue CPAP at bedtime.    Class 1 obesity  Current BMI 33.05 kg/m. Could benefit from lifestyle modifications. Should follow-up closely with PCP.    Advance Care Planning:   Code Status: Full Code   Consults: [facility].  Family Communication:   Severity of Illness: The appropriate patient status for this patient is INPATIENT. Inpatient status is judged to be reasonable and necessary in order to provide the required intensity of service  to ensure the patient's safety. The patient's presenting symptoms, physical exam findings, and initial radiographic and laboratory data in the context of their chronic comorbidities is felt to place them at high risk for further clinical deterioration. Furthermore, it is not anticipated that the patient will be medically stable for discharge from the hospital within 2 midnights of admission.   * I certify that at the point of admission it is my clinical judgment that the patient will require inpatient hospital care spanning beyond 2 midnights from the point of admission due to high intensity of service, high risk for further deterioration and high frequency of surveillance required.*  Author: Bobette Mo, MD 06/11/2023 9:58 AM  For on call review [email or web address].   This document was prepared using Dragon voice recognition software and may contain some unintended transcription errors.

## 2023-06-11 NOTE — ED Provider Notes (Signed)
Nursing notes and vitals signs, including pulse oximetry, reviewed.  Summary of this visit's results, reviewed by myself:  EKG:  EKG Interpretation  Date/Time:    Ventricular Rate:    PR Interval:    QRS Duration:   QT Interval:    QTC Calculation:   R Axis:     Text Interpretation:          Labs:  Results for orders placed or performed during the hospital encounter of 06/10/23 (from the past 24 hour(s))  Lipase, blood     Status: Abnormal   Collection Time: 06/10/23  8:39 PM  Result Value Ref Range   Lipase 503 (H) 11 - 51 U/L  Comprehensive metabolic panel     Status: Abnormal   Collection Time: 06/10/23  8:39 PM  Result Value Ref Range   Sodium 135 135 - 145 mmol/L   Potassium 3.2 (L) 3.5 - 5.1 mmol/L   Chloride 100 98 - 111 mmol/L   CO2 24 22 - 32 mmol/L   Glucose, Bld 111 (H) 70 - 99 mg/dL   BUN 11 8 - 23 mg/dL   Creatinine, Ser 4.09 0.44 - 1.00 mg/dL   Calcium 8.7 (L) 8.9 - 10.3 mg/dL   Total Protein 7.6 6.5 - 8.1 g/dL   Albumin 3.8 3.5 - 5.0 g/dL   AST 32 15 - 41 U/L   ALT 28 0 - 44 U/L   Alkaline Phosphatase 91 38 - 126 U/L   Total Bilirubin 1.2 0.3 - 1.2 mg/dL   GFR, Estimated >81 >19 mL/min   Anion gap 11 5 - 15  CBC     Status: Abnormal   Collection Time: 06/10/23  8:39 PM  Result Value Ref Range   WBC 7.9 4.0 - 10.5 K/uL   RBC 5.53 (H) 3.87 - 5.11 MIL/uL   Hemoglobin 16.2 (H) 12.0 - 15.0 g/dL   HCT 14.7 (H) 82.9 - 56.2 %   MCV 88.6 80.0 - 100.0 fL   MCH 29.3 26.0 - 34.0 pg   MCHC 33.1 30.0 - 36.0 g/dL   RDW 13.0 86.5 - 78.4 %   Platelets 222 150 - 400 K/uL   nRBC 0.0 0.0 - 0.2 %    Imaging Studies: US Abdomen Limited RUQ (LIVER/GB)  Result Date: 06/11/2023 CLINICAL DATA:  Abnormal CT. EXAM: ULTRASOUND ABDOMEN LIMITED RIGHT UPPER QUADRANT COMPARISON:  CT with IV contrast yesterday, showing gallbladder thickening and pericholecystic fluid with a prominent common bile duct with questionable ampullary region mass. FINDINGS: Gallbladder: Portions  of the gallbladder free wall are prominent measuring up to 4 mm in thickness and there is a small amount of pericholecystic fluid. There was no positive sonographic Murphy's sign, but the patient had been pain medicated prior to the study. There is hypoechoic layering sludge in the gallbladder but no shadowing stones. CT and ultrasound findings are concerning for acalculous cholecystitis. Common bile duct: Diameter: Prominent measuring 11 mm, with mild intrahepatic biliary prominence. Liver: No focal lesion identified. Within normal limits in parenchymal echogenicity. Portal vein is patent on color Doppler imaging with normal direction of blood flow towards the liver. Other: None. IMPRESSION: 1. Gallbladder wall thickening and pericholecystic fluid with layering sludge in the gallbladder. Findings are concerning for acalculous cholecystitis. Sonographic Murphy's sign unable to be assessed due to the patient's pain medicated state. 2. Prominent common bile duct with mild intrahepatic biliary prominence. Etiology indeterminate. Electronically Signed   By: Almira Bar M.D.   On: 06/11/2023 02:17  CT ABDOMEN PELVIS W CONTRAST  Result Date: 06/11/2023 CLINICAL DATA:  Abdominal pain. EXAM: CT ABDOMEN AND PELVIS WITH CONTRAST TECHNIQUE: Multidetector CT imaging of the abdomen and pelvis was performed using the standard protocol following bolus administration of intravenous contrast. RADIATION DOSE REDUCTION: This exam was performed according to the departmental dose-optimization program which includes automated exposure control, adjustment of the mA and/or kV according to patient size and/or use of iterative reconstruction technique. CONTRAST:  OMNIPAQUE IOHEXOL 300 MG/ML  SOLN COMPARISON:  None Available. FINDINGS: Lower chest: Mild lingular and bibasilar atelectasis is seen. Hepatobiliary: No focal liver abnormality is seen. No gallstones are seen within a moderately distended gallbladder. Mild  pericholecystic inflammation is noted. The common bile duct measures 8.8 mm in diameter, with an 18 mm x 17 mm x 17 mm area of low attenuation seen within the region of the ampulla (axial CT image 32, CT series 3). Pancreas: Unremarkable. No pancreatic ductal dilatation or surrounding inflammatory changes. Spleen: Normal in size without focal abnormality. Adrenals/Urinary Tract: Adrenal glands are unremarkable. Kidneys are normal, without renal calculi, focal lesion, or hydronephrosis. Bladder is unremarkable. Stomach/Bowel: Stomach is within normal limits. Appendix appears normal. No evidence of bowel wall thickening, distention, or inflammatory changes. Vascular/Lymphatic: Aortic atherosclerosis. No enlarged abdominal or pelvic lymph nodes. Reproductive: Status post hysterectomy. No adnexal masses. Other: No abdominal wall hernia or abnormality. There is a small amount of posterior pelvic free fluid. Musculoskeletal: Multilevel degenerative changes are seen within the lumbar spine, most prominent at the level of L5-S1. IMPRESSION: 1. Moderately distended gallbladder with mild pericholecystic inflammation, which may represent sequelae associated with acalculous cholecystitis. Correlation with gallbladder ultrasound is recommended. 2. Mild dilatation of the common bile duct with an area of low attenuation seen within the region of the ampulla. Correlation with ERCP is recommended to exclude the presence of an underlying neoplastic process. 3. Small amount of posterior pelvic free fluid. 4. Evidence of prior hysterectomy. 5. Aortic atherosclerosis. Aortic Atherosclerosis (ICD10-I70.0). Electronically Signed   By: Aram Candela M.D.   On: 06/11/2023 00:01    1:21 AM Patient denies pain.  She has some mild epigastric and right upper quadrant tenderness.  We will obtain an ultrasound to evaluate the findings seen on CT scan.  3:07 AM The patient has a significantly elevated lipase (503) as well as changes  concerning for a calculus cholecystitis and ductal dilatation.  I believe we should get her admitted for further studies such as ERCP and HIDA scan.  4:03 AM Dr. Loney Loh to admit to the hospitalist service.  Dr. Doylene Canard of general surgery consulted and they will see the patient later today in consultation.  Dr. Doylene Canard recommended an MRCP.     Khan Chura, Jonny Ruiz, MD 06/11/23 [phone]

## 2023-06-11 NOTE — ED Notes (Signed)
LR stopped at per verbal order from EDP PA-C [place] due to patient's hx of CHF.

## 2023-06-11 NOTE — Consult Note (Signed)
Consult Note  Kelly Frank 1961-05-23  [id number].    Requesting MD: Dr. Read Drivers Chief Complaint/Reason for Consult: abdominal pain, cholecystitis, ? Ampullary lesion  HPI:  62 y.o. female with medical history significant for HTN, thyroid disorder, CHF(last echo she believes this year, but at [facility] center so we don't have access to this) who [facility] with abdominal pain on 6/11. Pain began Monday (6/10) and is generalized and constant. It began while she was driving. She has had associated nausea with 5-6 episodes of vomiting. Bowel movements have been normal with last BM  on Monday. She has not experienced episodes like this previously. She went to UC prior to ED visit with recommendation to proceed to the ED. She denies fever, chills, hematochezia, melena, respiratory or urinary symptoms.  Her pain has resolved currently.  Work up in ED significant for CT scan showing distended GB with pericholecystic inflammation and mild dilatation of CBD, ampullary lesion?. [place] with concerns for acalculous cholecystitis as well and with mild intrahepatic biliary prominence. Patient is being transferred to [facility] hospitalist service and general surgery asked to see in consultation regarding gallbladder findings.  She states her sister died of breast cancer and her brother died of cancer as well but she isn't sure which.  Substance use: occ alcohol use. Daily cigarette smoker Allergies: NKDA Blood thinners: none Past Surgeries: abdominal hysterectomy   ROS: ROS reviewed and as above  Family History  Problem Relation Age of Onset   Diabetes Mother    Hypertension Mother    Breast cancer Sister 76   Heart attack Neg Hx    Hyperlipidemia Neg Hx    Sudden death Neg Hx     Past Medical History:  Diagnosis Date   DOE (dyspnea on exertion)    Hypertension    Thyroid disease     Past Surgical History:  Procedure Laterality Date   ABDOMINAL HYSTERECTOMY      SHOULDER SURGERY  2000   left reconstructive    SHOULDER SURGERY Left     Social History:  reports that she has been smoking cigarettes. She has a 12.50 pack-year smoking history. She has never used smokeless tobacco. She reports current alcohol use. She reports that she does not use drugs.  Allergies: No Known Allergies  (Not in a hospital admission)   Blood pressure (!) 164/92, pulse (!) 57, temperature 98.4 F (36.9 C), temperature source Oral, resp. rate 17, height 5\' 7"  (1.702 m), weight 95.7 kg, SpO2 98 %. Physical Exam: General: pleasant, WD, female who is laying in bed in NAD HEENT: head is normocephalic, atraumatic.  Sclera are noninjected.  Pupils equal and round. EOMs intact.  Ears and nose without any masses or lesions.  Mouth is pink and moist Heart: regular, rate, and rhythm.  Normal s1,s2. No obvious murmurs, gallops, or rubs noted.  Palpable radial and pedal pulses bilaterally Lungs: CTAB, no wheezes, rhonchi, or rales noted.  Respiratory effort nonlabored Abd: soft, NT, ND, +BS, no masses, hernias, or organomegaly Psych: A&Ox3 with an appropriate affect.    Results for orders placed or performed during the hospital encounter of 06/10/23 (from the past 48 hour(s))  Lipase, blood     Status: Abnormal   Collection Time: 06/10/23  8:39 PM  Result Value Ref Range   Lipase 503 (H) 11 - 51 U/L    Comment: RESULTS CONFIRMED BY MANUAL DILUTION Performed at [facility], [address] [place], [place] [address]   Comprehensive metabolic panel     Status: Abnormal   Collection Time: 06/10/23  8:39 PM  Result Value Ref Range   Sodium 135 135 - 145 mmol/L   Potassium 3.2 (L) 3.5 - 5.1 mmol/L   Chloride 100 98 - 111 mmol/L   CO2 24 22 - 32 mmol/L   Glucose, Bld 111 (H) 70 - 99 mg/dL    Comment: Glucose reference range applies only to samples taken after fasting for at least 8 hours.   BUN 11 8 - 23 mg/dL   Creatinine, Ser 6.04 0.44 - 1.00 mg/dL   Calcium 8.7  (L) 8.9 - 10.3 mg/dL   Total Protein 7.6 6.5 - 8.1 g/dL   Albumin 3.8 3.5 - 5.0 g/dL   AST 32 15 - 41 U/L   ALT 28 0 - 44 U/L   Alkaline Phosphatase 91 38 - 126 U/L   Total Bilirubin 1.2 0.3 - 1.2 mg/dL   GFR, Estimated >54 >09 mL/min    Comment: (NOTE) Calculated using the CKD-EPI Creatinine Equation (2021)    Anion gap 11 5 - 15    Comment: Performed at [facility], [address] [place], [place] [address]  CBC     Status: Abnormal   Collection Time: 06/10/23  8:39 PM  Result Value Ref Range   WBC 7.9 4.0 - 10.5 K/uL   RBC 5.53 (H) 3.87 - 5.11 MIL/uL   Hemoglobin 16.2 (H) 12.0 - 15.0 g/dL   HCT 47.8 (H) 29.5 - 62.1 %   MCV 88.6 80.0 - 100.0 fL   MCH 29.3 26.0 - 34.0 pg   MCHC 33.1 30.0 - 36.0 g/dL   RDW 30.8 65.7 - 84.6 %   Platelets 222 150 - 400 K/uL   nRBC 0.0 0.0 - 0.2 %    Comment: Performed at [facility], [address] [place], [place] [address]   US Abdomen Limited RUQ (LIVER/GB)  Result Date: 06/11/2023 CLINICAL DATA:  Abnormal CT. EXAM: ULTRASOUND ABDOMEN LIMITED RIGHT UPPER QUADRANT COMPARISON:  CT with IV contrast yesterday, showing gallbladder thickening and pericholecystic fluid with a prominent common bile duct with questionable ampullary region mass. FINDINGS: Gallbladder: Portions of the gallbladder free wall are prominent measuring up to 4 mm in thickness and there is a small amount of pericholecystic fluid. There was no positive sonographic Murphy's sign, but the patient had been pain medicated prior to the study. There is hypoechoic layering sludge in the gallbladder but no shadowing stones. CT and ultrasound findings are concerning for acalculous cholecystitis. Common bile duct: Diameter: Prominent measuring 11 mm, with mild intrahepatic biliary prominence. Liver: No focal lesion identified. Within normal limits in parenchymal echogenicity. Portal vein is patent on color Doppler imaging with normal direction of blood flow towards the  liver. Other: None. IMPRESSION: 1. Gallbladder wall thickening and pericholecystic fluid with layering sludge in the gallbladder. Findings are concerning for acalculous cholecystitis. Sonographic Murphy's sign unable to be assessed due to the patient's pain medicated state. 2. Prominent common bile duct with mild intrahepatic biliary prominence. Etiology indeterminate. Electronically Signed   By: Almira Bar M.D.   On: 06/11/2023 02:17   CT ABDOMEN PELVIS W CONTRAST  Result Date: 06/11/2023 CLINICAL DATA:  Abdominal pain. EXAM: CT ABDOMEN AND PELVIS WITH CONTRAST TECHNIQUE: Multidetector CT imaging of the abdomen and pelvis was performed using the standard protocol following bolus administration of intravenous contrast. RADIATION DOSE REDUCTION:  This exam was performed according to the departmental dose-optimization program which includes automated exposure control, adjustment of the mA and/or kV according to patient size and/or use of iterative reconstruction technique. CONTRAST:  OMNIPAQUE IOHEXOL 300 MG/ML  SOLN COMPARISON:  None Available. FINDINGS: Lower chest: Mild lingular and bibasilar atelectasis is seen. Hepatobiliary: No focal liver abnormality is seen. No gallstones are seen within a moderately distended gallbladder. Mild pericholecystic inflammation is noted. The common bile duct measures 8.8 mm in diameter, with an 18 mm x 17 mm x 17 mm area of low attenuation seen within the region of the ampulla (axial CT image 32, CT series 3). Pancreas: Unremarkable. No pancreatic ductal dilatation or surrounding inflammatory changes. Spleen: Normal in size without focal abnormality. Adrenals/Urinary Tract: Adrenal glands are unremarkable. Kidneys are normal, without renal calculi, focal lesion, or hydronephrosis. Bladder is unremarkable. Stomach/Bowel: Stomach is within normal limits. Appendix appears normal. No evidence of bowel wall thickening, distention, or inflammatory changes.  Vascular/Lymphatic: Aortic atherosclerosis. No enlarged abdominal or pelvic lymph nodes. Reproductive: Status post hysterectomy. No adnexal masses. Other: No abdominal wall hernia or abnormality. There is a small amount of posterior pelvic free fluid. Musculoskeletal: Multilevel degenerative changes are seen within the lumbar spine, most prominent at the level of L5-S1. IMPRESSION: 1. Moderately distended gallbladder with mild pericholecystic inflammation, which may represent sequelae associated with acalculous cholecystitis. Correlation with gallbladder ultrasound is recommended. 2. Mild dilatation of the common bile duct with an area of low attenuation seen within the region of the ampulla. Correlation with ERCP is recommended to exclude the presence of an underlying neoplastic process. 3. Small amount of posterior pelvic free fluid. 4. Evidence of prior hysterectomy. 5. Aortic atherosclerosis. Aortic Atherosclerosis (ICD10-I70.0). Electronically Signed   By: Aram Candela M.D.   On: 06/11/2023 00:01      Assessment/Plan Possible Acalculous cholecystitis Ampullary lesion vs choledocholithiasis  Pancreatitis  Patient seen and examined and relevant labs and imaging reviewed. Imaging, history, and exam consistent with acalculous cholecystitis. There is biliary ductal dilatation and question of ampullary lesion that merits further work up likely with a MRCP.  Recommend GI evaluation for further recommendations for this work up. For now recommend antibiotics. Pending further work up laparoscopic cholecystectomy may be indicated this admission, but will follow up along for further recommendations.  We will continue to follow.  FEN: may have at least CLD from our standpoint ID:  rocephin VTE: Lovenox  CHF - unknown EF, on Entresto Hypothyroidism  HTN  I reviewed ED provider notes, hospitalist notes, last 24 h vitals and pain scores, last 48 h intake and output, last 24 h labs and trends, and last  24 h imaging results.   Letha Cape, [facility] Surgery 06/11/2023, 6:54 AM Please see Amion for pager number during day hours 7:00am-4:30pm

## 2023-06-11 NOTE — Progress Notes (Signed)
Plan of Care Note for accepted transfer   Patient: Kelly Frank MRN: [id number]   DOA: 06/10/2023  Facility requesting transfer: [facility] Requesting Provider: Dr. Read Drivers Reason for transfer: Acute acalculous cholecystitis Facility course: 62 year old female with history of chronic HFrEF, hypertension, hypothyroidism, OSA on CPAP presented to ED with complaints of generalized abdominal pain, nausea, and vomiting.  Blood pressure 155/118 on arrival, remainder of vital signs stable.  Labs showing no leukocytosis, hemoglobin 16.2, potassium 3.2, creatinine 0.9, normal LFTs, lipase 503, UA pending.  CT abdomen pelvis with contrast: "IMPRESSION: 1. Moderately distended gallbladder with mild pericholecystic inflammation, which may represent sequelae associated with acalculous cholecystitis. Correlation with gallbladder ultrasound is recommended. 2. Mild dilatation of the common bile duct with an area of low attenuation seen within the region of the ampulla. Correlation with ERCP is recommended to exclude the presence of an underlying neoplastic process. 3. Small amount of posterior pelvic free fluid. 4. Evidence of prior hysterectomy. 5. Aortic atherosclerosis."  Right upper quadrant ultrasound: "IMPRESSION: 1. Gallbladder wall thickening and pericholecystic fluid with layering sludge in the gallbladder. Findings are concerning for acalculous cholecystitis. Sonographic Murphy's sign unable to be assessed due to the patient's pain medicated state. 2. Prominent common bile duct with mild intrahepatic biliary prominence. Etiology indeterminate."  Patient was given Bentyl, Zofran, oral potassium 40 mEq, IV Pepcid, and IV fluids. ED physician discussed the case with Dr. Fredricka Bonine who recommended MRCP, general surgery will consult.  Plan of care: The patient is accepted for admission to Telemetry unit, at [facility].  [facility] will assume care on arrival to accepting facility.  Until arrival, care as per EDP. However, [facility] available 24/7 for questions and assistance.  Author: John Giovanni, MD 06/11/2023  Check [email or web address] for on-call coverage.  Nursing staff, Please call [facility] Admits & Consults System-Wide number on Amion as soon as patient's arrival, so appropriate admitting provider can evaluate the pt.

## 2023-06-12 ENCOUNTER — Inpatient Hospital Stay (HOSPITAL_COMMUNITY): Payer: Federal, State, Local not specified - PPO | Admitting: Anesthesiology

## 2023-06-12 ENCOUNTER — Encounter (HOSPITAL_COMMUNITY): Payer: Self-pay | Admitting: Internal Medicine

## 2023-06-12 ENCOUNTER — Encounter (HOSPITAL_COMMUNITY)
Admission: EM | Disposition: A | Discharge status: Home / Self Care | Payer: Self-pay | Source: Home / Self Care | Attending: Internal Medicine

## 2023-06-12 ENCOUNTER — Inpatient Hospital Stay (HOSPITAL_COMMUNITY): Payer: Federal, State, Local not specified - PPO

## 2023-06-12 DIAGNOSIS — R112 Nausea with vomiting, unspecified: Secondary | ICD-10-CM

## 2023-06-12 DIAGNOSIS — R932 Abnormal findings on diagnostic imaging of liver and biliary tract: Secondary | ICD-10-CM | POA: Diagnosis not present

## 2023-06-12 DIAGNOSIS — R1084 Generalized abdominal pain: Secondary | ICD-10-CM | POA: Diagnosis not present

## 2023-06-12 DIAGNOSIS — K810 Acute cholecystitis: Secondary | ICD-10-CM | POA: Diagnosis not present

## 2023-06-12 DIAGNOSIS — K838 Other specified diseases of biliary tract: Secondary | ICD-10-CM | POA: Diagnosis not present

## 2023-06-12 DIAGNOSIS — I5042 Chronic combined systolic (congestive) and diastolic (congestive) heart failure: Secondary | ICD-10-CM | POA: Diagnosis not present

## 2023-06-12 DIAGNOSIS — R748 Abnormal levels of other serum enzymes: Secondary | ICD-10-CM

## 2023-06-12 HISTORY — PX: ESOPHAGOGASTRODUODENOSCOPY (EGD) WITH PROPOFOL: SHX5813

## 2023-06-12 LAB — ECHOCARDIOGRAM COMPLETE
AR max vel: 3.19 cm2
AV Area VTI: 3.1 cm2
AV Area mean vel: 3 cm2
AV Mean grad: 3 mmHg
AV Peak grad: 6.5 mmHg
Ao pk vel: 1.27 m/s
Area-P 1/2: 2.82 cm2
Calc EF: 44.9 %
Height: 67 in
MV VTI: 4.06 cm2
S' Lateral: 4.4 cm
Single Plane A2C EF: 45.8 %
Single Plane A4C EF: 46.1 %
Weight: 3376 oz

## 2023-06-12 LAB — CBC
HCT: 44 % (ref 36.0–46.0)
Hemoglobin: 14.3 g/dL (ref 12.0–15.0)
MCH: 29.5 pg (ref 26.0–34.0)
MCHC: 32.5 g/dL (ref 30.0–36.0)
MCV: 90.7 fL (ref 80.0–100.0)
Platelets: 174 10*3/uL (ref 150–400)
RBC: 4.85 MIL/uL (ref 3.87–5.11)
RDW: 14.3 % (ref 11.5–15.5)
WBC: 5.6 10*3/uL (ref 4.0–10.5)
nRBC: 0 % (ref 0.0–0.2)

## 2023-06-12 LAB — COMPREHENSIVE METABOLIC PANEL
ALT: 19 U/L (ref 0–44)
AST: 19 U/L (ref 15–41)
Albumin: 3 g/dL — ABNORMAL LOW (ref 3.5–5.0)
Alkaline Phosphatase: 68 U/L (ref 38–126)
Anion gap: 12 (ref 5–15)
BUN: 17 mg/dL (ref 8–23)
CO2: 22 mmol/L (ref 22–32)
Calcium: 8.5 mg/dL — ABNORMAL LOW (ref 8.9–10.3)
Chloride: 103 mmol/L (ref 98–111)
Creatinine, Ser: 0.98 mg/dL (ref 0.44–1.00)
GFR, Estimated: >60 mL/min (ref >60)
Glucose, Bld: 70 mg/dL (ref 70–99)
Potassium: 3.9 mmol/L (ref 3.5–5.1)
Sodium: 137 mmol/L (ref 135–145)
Total Bilirubin: 1 mg/dL (ref 0.3–1.2)
Total Protein: 6.1 g/dL — ABNORMAL LOW (ref 6.5–8.1)

## 2023-06-12 LAB — HIV ANTIBODY (ROUTINE TESTING W REFLEX): HIV Screen 4th Generation wRfx: NONREACTIVE

## 2023-06-12 LAB — LIPASE, BLOOD: Lipase: 58 U/L — ABNORMAL HIGH (ref 11–51)

## 2023-06-12 SURGERY — ESOPHAGOGASTRODUODENOSCOPY (EGD) WITH PROPOFOL
Anesthesia: Monitor Anesthesia Care

## 2023-06-12 MED ORDER — PROPOFOL 1000 MG/100ML IV EMUL
INTRAVENOUS | Status: AC
  Filled 2023-06-12: qty 100

## 2023-06-12 MED ORDER — LIDOCAINE 2% (20 MG/ML) 5 ML SYRINGE
INTRAMUSCULAR | Status: DC | PRN
  Administered 2023-06-12: 60 mg via INTRAVENOUS

## 2023-06-12 MED ORDER — POTASSIUM CHLORIDE IN NACL 20-0.9 MEQ/L-% IV SOLN
INTRAVENOUS | Status: DC
Start: 2023-06-12 — End: 2023-06-12

## 2023-06-12 MED ORDER — LACTATED RINGERS IV SOLN: INTRAVENOUS | Status: DC

## 2023-06-12 MED ORDER — ONDANSETRON HCL 4 MG/2ML IJ SOLN
INTRAMUSCULAR | Status: DC | PRN
  Administered 2023-06-12: 4 mg via INTRAVENOUS

## 2023-06-12 MED ORDER — SODIUM CHLORIDE 0.9 % IV SOLN: INTRAVENOUS | Status: DC

## 2023-06-12 MED ORDER — POTASSIUM CHLORIDE IN NACL 20-0.9 MEQ/L-% IV SOLN
INTRAVENOUS | Status: AC
  Filled 2023-06-12: qty 1000

## 2023-06-12 MED ORDER — PROPOFOL 500 MG/50ML IV EMUL
INTRAVENOUS | Status: DC | PRN
  Administered 2023-06-12: 150 ug/kg/min via INTRAVENOUS

## 2023-06-12 MED ORDER — PROPOFOL 10 MG/ML IV BOLUS
INTRAVENOUS | Status: DC | PRN
  Administered 2023-06-12 (×2): 10 mg via INTRAVENOUS
  Administered 2023-06-12: 20 mg via INTRAVENOUS

## 2023-06-12 MED ORDER — PANTOPRAZOLE SODIUM 40 MG PO TBEC
40.0000 mg | DELAYED_RELEASE_TABLET | Freq: Every day | ORAL | Status: DC
  Administered 2023-06-12 – 2023-06-13 (×2): 40 mg via ORAL
  Filled 2023-06-12 (×2): qty 1

## 2023-06-12 SURGICAL SUPPLY — 15 items

## 2023-06-12 NOTE — Progress Notes (Signed)
Subjective: Hungry.  No abdominal pain.  Getting echo  ROS: See above, otherwise other systems negative  Objective: Vital signs in last 24 hours: Temp:  [97.8 F (36.6 C)-98.3 F (36.8 C)] 97.8 F (36.6 C) (06/13 0609) Pulse Rate:  [51-72] 53 (06/13 0609) Resp:  [16-20] 16 (06/13 0609) BP: (130-159)/(77-91) 137/84 (06/13 0609) SpO2:  [95 %-100 %] 98 % (06/13 0609) Last BM Date : 06/11/23  Intake/Output from previous day: 06/12 0701 - 06/13 0700 In: 425.2 [I.V.:213; IV Piggyback:212.2] Out: -  Intake/Output this shift: No intake/output data recorded.  PE: Abd: soft, NT, ND, +BS  Lab Results:  Recent Labs    06/11/23 1045 06/12/23 0542  WBC 6.9 5.6  HGB 14.9 14.3  HCT 45.8 44.0  PLT 188 174   BMET Recent Labs    06/11/23 1045 06/12/23 0542  NA 137 137  K 3.9 3.9  CL 103 103  CO2 24 22  GLUCOSE 88 70  BUN 13 17  CREATININE 1.00 0.98  CALCIUM 8.8* 8.5*   PT/INR No results for input(s): "LABPROT", "INR" in the last 72 hours. CMP     Component Value Date/Time   NA 137 06/12/2023 0542   K 3.9 06/12/2023 0542   CL 103 06/12/2023 0542   CO2 22 06/12/2023 0542   GLUCOSE 70 06/12/2023 0542   BUN 17 06/12/2023 0542   CREATININE 0.98 06/12/2023 0542   CALCIUM 8.5 (L) 06/12/2023 0542   PROT 6.1 (L) 06/12/2023 0542   ALBUMIN 3.0 (L) 06/12/2023 0542   AST 19 06/12/2023 0542   ALT 19 06/12/2023 0542   ALKPHOS 68 06/12/2023 0542   BILITOT 1.0 06/12/2023 0542   GFRNONAA >60 06/12/2023 0542   GFRAA 55 (L) 04/19/2017 0215   Lipase     Component Value Date/Time   LIPASE 58 (H) 06/12/2023 0542       Studies/Results: MR ABDOMEN MRCP W WO CONTAST  Result Date: 06/12/2023 CLINICAL DATA:  62 year old female with history of right upper quadrant abdominal pain. EXAM: MRI ABDOMEN WITHOUT AND WITH CONTRAST (INCLUDING MRCP) TECHNIQUE: Multiplanar multisequence MR imaging of the abdomen was performed both before and after the administration of intravenous  contrast. Heavily T2-weighted images of the biliary and pancreatic ducts were obtained, and three-dimensional MRCP images were rendered by post processing. CONTRAST:  10mL GADAVIST GADOBUTROL 1 MMOL/ML IV SOLN COMPARISON:  No prior abdominal MRI. CT of the abdomen and pelvis 06/10/2023. Abdominal ultrasound 06/11/2023. FINDINGS: Lower chest: Mild cardiomegaly. Hepatobiliary: No suspicious cystic or solid hepatic lesions. Gallbladder is moderately distended. Gallbladder wall appears mildly thickened and edematous with trace volume of pericholecystic fluid. No definite well-defined filling defect noted within the lumen of the gallbladder to suggest choledocholithiasis. However, there does appear to be a small amount of amorphous T1 hyperintensity lying dependently in the gallbladder, which could indicate biliary sludge. Some MRCP images are limited by considerable patient motion. However, with this limitation in mind, there is no intrahepatic biliary ductal dilatation. Common bile duct is mildly dilated measuring up to 11 mm in the porta hepatis. Possible prolapse of the distal common bile duct and ampulla into the second portion of the duodenum, best appreciated on coronal T2 image 13 of series 6 and coronal image 30 of series 23, which demonstrates some focal narrowing of the distal common bile duct in this region to proximally 2 mm in diameter. There is some mild enhancement of the distal common bile duct at the level of the  ampulla (axial image 61 of series 21), without a well-defined mass. Pancreas: No pancreatic mass. No pancreatic ductal dilatation. No pancreatic or peripancreatic fluid collections or inflammatory changes. Spleen:  Unremarkable. Adrenals/Urinary Tract: Bilateral kidneys and bilateral adrenal glands are normal in appearance. No hydroureteronephrosis in the visualized portions of the abdomen. Stomach/Bowel: Visualized portions are unremarkable. Vascular/Lymphatic: No aneurysm identified in the  visualized abdominal vasculature. No lymphadenopathy noted in the abdomen. Other:  No significant volume of ascites. Musculoskeletal: No aggressive appearing osseous lesions are noted in the visualized portions of the skeleton. IMPRESSION: 1. Moderate gallbladder distention with mild gallbladder wall thickening and edema, and trace volume of pericholecystic fluid. Although there are no definite gallstones, there does appear to be a small amount of biliary sludge lying dependently in the gallbladder. Further clinical evaluation for signs and symptoms of acute cholecystitis is recommended. 2. Unusual appearance of the distal common bile duct, with apparent prolapse of the distal common bile duct and ampulla into the second portion of the duodenal. No discrete ampullary mass confidently identified, although the ampulla does demonstrate subtle enhancement. This results in waist like narrowing of the distal common bile duct just proximal to the prolapse segment where the lumen is narrowed to only 2 mm, associated with mild proximal common bile duct dilatation (11 mm). Further evaluation with endoscopic ultrasound and possible ERCP should be considered if clinically appropriate. 3. No choledocholithiasis. 4. Mild cardiomegaly. Electronically Signed   By: Trudie Reed M.D.   On: 06/12/2023 09:02   MR 3D Recon At Scanner  Result Date: 06/12/2023 CLINICAL DATA:  63 year old female with history of right upper quadrant abdominal pain. EXAM: MRI ABDOMEN WITHOUT AND WITH CONTRAST (INCLUDING MRCP) TECHNIQUE: Multiplanar multisequence MR imaging of the abdomen was performed both before and after the administration of intravenous contrast. Heavily T2-weighted images of the biliary and pancreatic ducts were obtained, and three-dimensional MRCP images were rendered by post processing. CONTRAST:  10mL GADAVIST GADOBUTROL 1 MMOL/ML IV SOLN COMPARISON:  No prior abdominal MRI. CT of the abdomen and pelvis 06/10/2023. Abdominal  ultrasound 06/11/2023. FINDINGS: Lower chest: Mild cardiomegaly. Hepatobiliary: No suspicious cystic or solid hepatic lesions. Gallbladder is moderately distended. Gallbladder wall appears mildly thickened and edematous with trace volume of pericholecystic fluid. No definite well-defined filling defect noted within the lumen of the gallbladder to suggest choledocholithiasis. However, there does appear to be a small amount of amorphous T1 hyperintensity lying dependently in the gallbladder, which could indicate biliary sludge. Some MRCP images are limited by considerable patient motion. However, with this limitation in mind, there is no intrahepatic biliary ductal dilatation. Common bile duct is mildly dilated measuring up to 11 mm in the porta hepatis. Possible prolapse of the distal common bile duct and ampulla into the second portion of the duodenum, best appreciated on coronal T2 image 13 of series 6 and coronal image 30 of series 23, which demonstrates some focal narrowing of the distal common bile duct in this region to proximally 2 mm in diameter. There is some mild enhancement of the distal common bile duct at the level of the ampulla (axial image 61 of series 21), without a well-defined mass. Pancreas: No pancreatic mass. No pancreatic ductal dilatation. No pancreatic or peripancreatic fluid collections or inflammatory changes. Spleen:  Unremarkable. Adrenals/Urinary Tract: Bilateral kidneys and bilateral adrenal glands are normal in appearance. No hydroureteronephrosis in the visualized portions of the abdomen. Stomach/Bowel: Visualized portions are unremarkable. Vascular/Lymphatic: No aneurysm identified in the visualized abdominal vasculature. No lymphadenopathy noted  in the abdomen. Other:  No significant volume of ascites. Musculoskeletal: No aggressive appearing osseous lesions are noted in the visualized portions of the skeleton. IMPRESSION: 1. Moderate gallbladder distention with mild gallbladder  wall thickening and edema, and trace volume of pericholecystic fluid. Although there are no definite gallstones, there does appear to be a small amount of biliary sludge lying dependently in the gallbladder. Further clinical evaluation for signs and symptoms of acute cholecystitis is recommended. 2. Unusual appearance of the distal common bile duct, with apparent prolapse of the distal common bile duct and ampulla into the second portion of the duodenal. No discrete ampullary mass confidently identified, although the ampulla does demonstrate subtle enhancement. This results in waist like narrowing of the distal common bile duct just proximal to the prolapse segment where the lumen is narrowed to only 2 mm, associated with mild proximal common bile duct dilatation (11 mm). Further evaluation with endoscopic ultrasound and possible ERCP should be considered if clinically appropriate. 3. No choledocholithiasis. 4. Mild cardiomegaly. Electronically Signed   By: Trudie Reed M.D.   On: 06/12/2023 09:02   US Abdomen Limited RUQ (LIVER/GB)  Result Date: 06/11/2023 CLINICAL DATA:  Abnormal CT. EXAM: ULTRASOUND ABDOMEN LIMITED RIGHT UPPER QUADRANT COMPARISON:  CT with IV contrast yesterday, showing gallbladder thickening and pericholecystic fluid with a prominent common bile duct with questionable ampullary region mass. FINDINGS: Gallbladder: Portions of the gallbladder free wall are prominent measuring up to 4 mm in thickness and there is a small amount of pericholecystic fluid. There was no positive sonographic Murphy's sign, but the patient had been pain medicated prior to the study. There is hypoechoic layering sludge in the gallbladder but no shadowing stones. CT and ultrasound findings are concerning for acalculous cholecystitis. Common bile duct: Diameter: Prominent measuring 11 mm, with mild intrahepatic biliary prominence. Liver: No focal lesion identified. Within normal limits in parenchymal echogenicity.  Portal vein is patent on color Doppler imaging with normal direction of blood flow towards the liver. Other: None. IMPRESSION: 1. Gallbladder wall thickening and pericholecystic fluid with layering sludge in the gallbladder. Findings are concerning for acalculous cholecystitis. Sonographic Murphy's sign unable to be assessed due to the patient's pain medicated state. 2. Prominent common bile duct with mild intrahepatic biliary prominence. Etiology indeterminate. Electronically Signed   By: Almira Bar M.D.   On: 06/11/2023 02:17   CT ABDOMEN PELVIS W CONTRAST  Result Date: 06/11/2023 CLINICAL DATA:  Abdominal pain. EXAM: CT ABDOMEN AND PELVIS WITH CONTRAST TECHNIQUE: Multidetector CT imaging of the abdomen and pelvis was performed using the standard protocol following bolus administration of intravenous contrast. RADIATION DOSE REDUCTION: This exam was performed according to the departmental dose-optimization program which includes automated exposure control, adjustment of the mA and/or kV according to patient size and/or use of iterative reconstruction technique. CONTRAST:  OMNIPAQUE IOHEXOL 300 MG/ML  SOLN COMPARISON:  None Available. FINDINGS: Lower chest: Mild lingular and bibasilar atelectasis is seen. Hepatobiliary: No focal liver abnormality is seen. No gallstones are seen within a moderately distended gallbladder. Mild pericholecystic inflammation is noted. The common bile duct measures 8.8 mm in diameter, with an 18 mm x 17 mm x 17 mm area of low attenuation seen within the region of the ampulla (axial CT image 32, CT series 3). Pancreas: Unremarkable. No pancreatic ductal dilatation or surrounding inflammatory changes. Spleen: Normal in size without focal abnormality. Adrenals/Urinary Tract: Adrenal glands are unremarkable. Kidneys are normal, without renal calculi, focal lesion, or hydronephrosis. Bladder is unremarkable.  Stomach/Bowel: Stomach is within normal limits. Appendix appears  normal. No evidence of bowel wall thickening, distention, or inflammatory changes. Vascular/Lymphatic: Aortic atherosclerosis. No enlarged abdominal or pelvic lymph nodes. Reproductive: Status post hysterectomy. No adnexal masses. Other: No abdominal wall hernia or abnormality. There is a small amount of posterior pelvic free fluid. Musculoskeletal: Multilevel degenerative changes are seen within the lumbar spine, most prominent at the level of L5-S1. IMPRESSION: 1. Moderately distended gallbladder with mild pericholecystic inflammation, which may represent sequelae associated with acalculous cholecystitis. Correlation with gallbladder ultrasound is recommended. 2. Mild dilatation of the common bile duct with an area of low attenuation seen within the region of the ampulla. Correlation with ERCP is recommended to exclude the presence of an underlying neoplastic process. 3. Small amount of posterior pelvic free fluid. 4. Evidence of prior hysterectomy. 5. Aortic atherosclerosis. Aortic Atherosclerosis (ICD10-I70.0). Electronically Signed   By: Aram Candela M.D.   On: 06/11/2023 00:01    Anti-infectives: Anti-infectives (From admission, onward)    Start     Dose/Rate Route Frequency Ordered Stop   06/11/23 1200  cefTRIAXone (ROCEPHIN) 2 g in sodium chloride 0.9 % 100 mL IVPB        2 g 200 mL/hr over 30 Minutes Intravenous Every 24 hours 06/11/23 1026     06/11/23 1200  metroNIDAZOLE (FLAGYL) IVPB 500 mg        500 mg 100 mL/hr over 60 Minutes Intravenous Every 12 hours 06/11/23 1026          Assessment/Plan Possible Acalculous cholecystitis ?Ampullary lesion  Pancreatitis -hungry and wants to eat -pancreatitis resolved -MRCP reviewed and briefly discussed with patient.  Will await further GI work up and findings prior to considering surgical intervention. -ok for diet from our standpoint -will continue to follow  FEN - may have diet VTE - Lovenox ID - Rocephin  CHF - unknown EF, on  Entresto, echo pending Hypothyroidism  HTN  I reviewed Consultant GI notes, hospitalist notes, last 24 h vitals and pain scores, last 48 h intake and output, last 24 h labs and trends, and last 24 h imaging results.   LOS: 1 day    Letha Cape , [facility] 06/12/2023, 10:23 AM Please see Amion for pager number during day hours 7:00am-4:30pm or 7:00am -11:30am on weekends

## 2023-06-12 NOTE — Anesthesia Preprocedure Evaluation (Addendum)
Anesthesia Evaluation  Patient identified by MRN, date of birth, ID band Patient awake    Reviewed: Allergy & Precautions, H&P , NPO status , Patient's Chart, lab work & pertinent test results  Airway Mallampati: II  TM Distance: >3 FB Neck ROM: Full    Dental no notable dental hx. (+) Teeth Intact, Dental Advisory Given   Pulmonary neg pulmonary ROS, sleep apnea and Continuous Positive Airway Pressure Ventilation , Current Smoker   Pulmonary exam normal breath sounds clear to auscultation       Cardiovascular Exercise Tolerance: Good hypertension, Pt. on medications +CHF and + DOE  negative cardio ROS Normal cardiovascular exam Rhythm:Regular Rate:Normal  1. Left ventricular ejection fraction, by estimation, is 40 to 45%. The left ventricle has mildly decreased function. The left ventricle demonstrates global hypokinesis. The left ventricular internal cavity size was mildly to moderately dilated. Left ventricular diastolic parameters are consistent with Grade I diastolic dysfunction (impaired relaxation).  2. Right ventricular systolic function is normal. The right ventricular size is normal.  3. Left atrial size was mildly dilated.  4. The mitral valve is normal in structure. Trivial mitral valve regurgitation. No evidence of mitral stenosis.  5. The aortic valve is normal in structure. There is mild calcification of the aortic valve. Aortic valve regurgitation is not visualized. No aortic stenosis is present.  6. The inferior vena cava is normal in size with greater than 50% respiratory variability, suggesting right atrial pressure of 3 mmHg.    Neuro/Psych negative neurological ROS  negative psych ROS   GI/Hepatic negative GI ROS, Neg liver ROS,,,  Endo/Other  negative endocrine ROS  Morbid obesity  Renal/GU negative Renal ROS  negative genitourinary   Musculoskeletal negative musculoskeletal ROS (+)    Abdominal    Peds negative pediatric ROS (+)  Hematology negative hematology ROS (+)   Anesthesia Other Findings   Reproductive/Obstetrics negative OB ROS                             Anesthesia Physical Anesthesia Plan  ASA: 3 and emergent  Anesthesia Plan: MAC   Post-op Pain Management: Minimal or no pain anticipated   Induction: Intravenous  PONV Risk Score and Plan: 2 and Ondansetron and Treatment may vary due to age or medical condition  Airway Management Planned: Mask and Natural Airway  Additional Equipment: None  Intra-op Plan:   Post-operative Plan:   Informed Consent: I have reviewed the patients History and Physical, chart, labs and discussed the procedure including the risks, benefits and alternatives for the proposed anesthesia with the patient or authorized representative who has indicated his/her understanding and acceptance.       Plan Discussed with: Anesthesiologist and CRNA  Anesthesia Plan Comments:         Anesthesia Quick Evaluation

## 2023-06-12 NOTE — Transfer of Care (Signed)
Immediate Anesthesia Transfer of Care Note  Patient: Kelly Frank  Procedure(s) Performed: ESOPHAGOGASTRODUODENOSCOPY (EGD) WITH PROPOFOL  Patient Location: PACU and Endoscopy Unit  Anesthesia Type:MAC  Level of Consciousness: awake, alert , and oriented  Airway & Oxygen Therapy: Patient Spontanous Breathing and Patient connected to face mask oxygen  Post-op Assessment: Report given to RN and Post -op Vital signs reviewed and stable  Post vital signs: Reviewed and stable  Last Vitals:  Vitals Value Taken Time  BP    Temp    Pulse    Resp    SpO2      Last Pain:  Vitals:   06/12/23 1354  TempSrc: Temporal  PainSc: 0-No pain         Complications: No notable events documented.

## 2023-06-12 NOTE — Op Note (Signed)
[facility] Patient Name: Kelly Frank Procedure Date: 06/12/2023 MRN: [id number] Attending MD: Vida Rigger , MD, [id number] Date of Birth: 23-Apr-1961 CSN: [id number] Age: 62 Admit Type: Inpatient Procedure:                Upper GI endoscopy Indications:              Abdominal pain in the right upper quadrant,                            Abnormal CT of the GI tract, Abnormal MRI of the GI                            tract Providers:                Vida Rigger, MD, Carlena Hurl, Martha Clan, RN, Leanne Lovely, Technician Referring MD:              Medicines:                Monitored Anesthesia Care Complications:            No immediate complications. Estimated Blood Loss:     Estimated blood loss: none. Procedure:                Pre-Anesthesia Assessment:                           - Prior to the procedure, a History and Physical                            was performed, and patient medications and                            allergies were reviewed. The patient's tolerance of                            previous anesthesia was also reviewed. The risks                            and benefits of the procedure and the sedation                            options and risks were discussed with the patient.                            All questions were answered, and informed consent                            was obtained. Prior Anticoagulants: The patient has                            taken Lovenox (enoxaparin), last dose was 1 day  prior to procedure. ASA Grade Assessment: II - A                            patient with mild systemic disease. After reviewing                            the risks and benefits, the patient was deemed in                            satisfactory condition to undergo the procedure.                           After obtaining informed consent, the endoscope was                             passed under direct vision. Throughout the                            procedure, the patient's blood pressure, pulse, and                            oxygen saturations were monitored continuously. The                            [id number]-[id number] ([id number]) Olympus endoscope was introduced                            through the mouth, and advanced to the third part                            of duodenum. The findings are noted below and then                            to be sure we exchanged the regular endoscope for                            the ERCP side-viewing scope i.e. the [id number]                            ([id number]) Olympus duodenoscope was introduced                            through the and advanced to the second portion of                            the duodenum. The upper GI endoscopy was                            accomplished without difficulty. The patient                            tolerated the procedure well. Scope In: Scope Out: Findings:      A small hiatal hernia  was present.      Patchy moderate inflammation characterized by adherent flecks of old       blood, congestion (edema) and erythema was found in the gastric body.      The ampulla, duodenal bulb, first portion of the duodenum, second       portion of the duodenum, major papilla, area of the papilla and third       portion of the duodenum were normal.      The exam was otherwise without abnormality. Impression:               - Small hiatal hernia.                           - Acute gastritis.                           - Normal ampulla, duodenal bulb, first portion of                            the duodenum, second portion of the duodenum, major                            papilla, area of the papilla and third portion of                            the duodenum.                           - The examination was otherwise normal. In the                            ampulla was seen with both straight and the ERCP                             scope and I think the adjacent folds for the CT and                            the MRI no mass lesion was seen and the biopsy                            forcep was used to move the folds out of the way                            and get an excellent view of the ampulla                           - No specimens collected. Moderate Sedation:      Not Applicable - Patient had care per Anesthesia. Recommendation:           - Soft diet today. Hopefully can go home soon                            unless surgery feels she needs lap chole tomorrow                           -  Continue present medications. Care with aspirin                            and nonsteroidals and use 1 over-the-counter                            Prilosec for 2 weeks                           - Return to GI clinic PRN.                           - Telephone GI clinic if symptomatic PRN.                           - Refer to a surgeon at appointment to be scheduled                            as an outpatient for follow-up to determine if                            cholecystectomy is needed unless they feel strongly                            to proceed tomorrow. Procedure Code(s):        --- Professional ---                           [phone], Esophagogastroduodenoscopy, flexible,                            transoral; diagnostic, including collection of                            specimen(s) by brushing or washing, when performed                            (separate procedure) Diagnosis Code(s):        --- Professional ---                           K44.9, Diaphragmatic hernia without obstruction or                            gangrene                           K29.00, Acute gastritis without bleeding                           R10.11, Right upper quadrant pain                           R93.3, Abnormal findings on diagnostic imaging of  other parts of digestive tract CPT copyright 2022  American Medical Association. All rights reserved. The codes documented in this report are preliminary and upon coder review may  be revised to meet current compliance requirements. Vida Rigger, MD 06/12/2023 2:34:53 PM This report has been signed electronically. Number of Addenda: 0

## 2023-06-12 NOTE — Progress Notes (Signed)
Mobility Specialist - Progress Note   06/12/23 1134  Mobility  Activity Ambulated with assistance in hallway  Level of Assistance Independent  Assistive Device Other (Comment) (IV Pole)  Distance Ambulated (ft) 500 ft  Activity Response Tolerated well  Mobility Referral Yes  $Mobility charge 1 Mobility  Mobility Specialist Start Time (ACUTE ONLY) 1106  Mobility Specialist Stop Time (ACUTE ONLY) 1134  Mobility Specialist Time Calculation (min) (ACUTE ONLY) 28 min   Pt received in bed and agreeable to mobility. No complaints during session. Pt to bathroom after session with all needs met.    Robert Wood Nayelly [facility]

## 2023-06-12 NOTE — Progress Notes (Addendum)
[facility] PROGRESS NOTE    Progress Note  Staphanie Blitch Frank  ZOX:[id number] DOB: 09/22/1961 DOA: 06/10/2023 [facility]CP: [facility]     Brief Narrative:   Kelly Frank is an 62 y.o. female past medical history significant for essential hypertension, chronic combined systolic and diastolic heart failure obstructive sleep apnea on CPAP comes into the emergency room with sudden onset right upper quadrant pain that started 3 days prior to admission, decreased oral intake.  CT scan of the abdomen pelvis showed distended gallbladder with pericholecystic inflammation with dilated CBD, GI was consulted    Assessment/Plan:   Acute acalculous cholecystitis Clear liquid diet, narcotics for analgesics. Continue antiemetics. Agree with Protonix. GI was consulted recommended MRCP also pending. Keep NPO. Lipase is significantly improved.  Chronic combined systolic and diastolic heart failure: Continue Coreg and Entresto. Hold furosemide. 2D echo results are pending.  Tobacco abuse Declined nicotine patch.  Essential hypertension Continue Coreg and Entresto hold diuretics.  Obstructive sleep apnea: Continue CPAP at night.  Hypokalemia: Repleted, now improved.  DVT prophylaxis: lovenox Family Communication:none Status is: Inpatient Remains inpatient appropriate because: Acute acalculous cholecystitis    Code Status:     Code Status Orders  (From admission, onward)           Start     Ordered   06/11/23 1028  Full code  Continuous       Question:  By:  Answer:  Consent: discussion documented in EHR   06/11/23 1029           Code Status History     This patient has a current code status but no historical code status.         IV Access:   Peripheral IV   Procedures and diagnostic studies:   US Abdomen Limited RUQ (LIVER/GB)  Result Date: 06/11/2023 CLINICAL DATA:  Abnormal CT. EXAM: ULTRASOUND ABDOMEN LIMITED RIGHT UPPER QUADRANT COMPARISON:   CT with IV contrast yesterday, showing gallbladder thickening and pericholecystic fluid with a prominent common bile duct with questionable ampullary region mass. FINDINGS: Gallbladder: Portions of the gallbladder free wall are prominent measuring up to 4 mm in thickness and there is a small amount of pericholecystic fluid. There was no positive sonographic Murphy's sign, but the patient had been pain medicated prior to the study. There is hypoechoic layering sludge in the gallbladder but no shadowing stones. CT and ultrasound findings are concerning for acalculous cholecystitis. Common bile duct: Diameter: Prominent measuring 11 mm, with mild intrahepatic biliary prominence. Liver: No focal lesion identified. Within normal limits in parenchymal echogenicity. Portal vein is patent on color Doppler imaging with normal direction of blood flow towards the liver. Other: None. IMPRESSION: 1. Gallbladder wall thickening and pericholecystic fluid with layering sludge in the gallbladder. Findings are concerning for acalculous cholecystitis. Sonographic Murphy's sign unable to be assessed due to the patient's pain medicated state. 2. Prominent common bile duct with mild intrahepatic biliary prominence. Etiology indeterminate. Electronically Signed   By: Almira Bar M.D.   On: 06/11/2023 02:17   CT ABDOMEN PELVIS W CONTRAST  Result Date: 06/11/2023 CLINICAL DATA:  Abdominal pain. EXAM: CT ABDOMEN AND PELVIS WITH CONTRAST TECHNIQUE: Multidetector CT imaging of the abdomen and pelvis was performed using the standard protocol following bolus administration of intravenous contrast. RADIATION DOSE REDUCTION: This exam was performed according to the departmental dose-optimization program which includes automated exposure control, adjustment of the mA and/or kV according to patient size and/or use of iterative reconstruction technique.  CONTRAST:  OMNIPAQUE IOHEXOL 300 MG/ML  SOLN COMPARISON:  None Available. FINDINGS:  Lower chest: Mild lingular and bibasilar atelectasis is seen. Hepatobiliary: No focal liver abnormality is seen. No gallstones are seen within a moderately distended gallbladder. Mild pericholecystic inflammation is noted. The common bile duct measures 8.8 mm in diameter, with an 18 mm x 17 mm x 17 mm area of low attenuation seen within the region of the ampulla (axial CT image 32, CT series 3). Pancreas: Unremarkable. No pancreatic ductal dilatation or surrounding inflammatory changes. Spleen: Normal in size without focal abnormality. Adrenals/Urinary Tract: Adrenal glands are unremarkable. Kidneys are normal, without renal calculi, focal lesion, or hydronephrosis. Bladder is unremarkable. Stomach/Bowel: Stomach is within normal limits. Appendix appears normal. No evidence of bowel wall thickening, distention, or inflammatory changes. Vascular/Lymphatic: Aortic atherosclerosis. No enlarged abdominal or pelvic lymph nodes. Reproductive: Status post hysterectomy. No adnexal masses. Other: No abdominal wall hernia or abnormality. There is a small amount of posterior pelvic free fluid. Musculoskeletal: Multilevel degenerative changes are seen within the lumbar spine, most prominent at the level of L5-S1. IMPRESSION: 1. Moderately distended gallbladder with mild pericholecystic inflammation, which may represent sequelae associated with acalculous cholecystitis. Correlation with gallbladder ultrasound is recommended. 2. Mild dilatation of the common bile duct with an area of low attenuation seen within the region of the ampulla. Correlation with ERCP is recommended to exclude the presence of an underlying neoplastic process. 3. Small amount of posterior pelvic free fluid. 4. Evidence of prior hysterectomy. 5. Aortic atherosclerosis. Aortic Atherosclerosis (ICD10-I70.0). Electronically Signed   By: Aram Candela M.D.   On: 06/11/2023 00:01     Medical Consultants:   None.   Subjective:    Kelly Frank  abdominal pain improved she relates she is hungry.  Objective:    Vitals:   06/11/23 1349 06/11/23 1823 06/11/23 2033 06/12/23 0609  BP: (!) 159/77 (!) 146/91 130/78 137/84  Pulse: (!) 51 72 (!) 52 (!) 53  Resp: 18 16 20 16   Temp: 98.3 F (36.8 C) 98.2 F (36.8 C) 97.8 F (36.6 C) 97.8 F (36.6 C)  TempSrc: Oral  Oral Oral  SpO2: 98% 100% 95% 98%  Weight:      Height:       SpO2: 98 %   Intake/Output Summary (Last 24 hours) at 06/12/2023 0752 Last data filed at 06/11/2023 1722 Gross per 24 hour  Intake 425.21 ml  Output --  Net 425.21 ml   Filed Weights   06/10/23 2035  Weight: 95.7 kg    Exam: General exam: In no acute distress. Respiratory system: Good air movement and clear to auscultation. Cardiovascular system: S1 & S2 heard, RRR. No JVD. Gastrointestinal system: Abdomen is nondistended, soft and nontender.  Extremities: No pedal edema. Skin: No rashes, lesions or ulcers Psychiatry: Judgement and insight appear normal. Mood & affect appropriate.    Data Reviewed:    Labs: Basic Metabolic Panel: Recent Labs  Lab 06/10/23 2039 06/11/23 1045 06/12/23 0542  NA 135 137 137  K 3.2* 3.9 3.9  CL 100 103 103  CO2 24 24 22   GLUCOSE 111* 88 70  BUN 11 13 17   CREATININE 0.99 1.00 0.98  CALCIUM 8.7* 8.8* 8.5*  MG  --  1.9  --   PHOS  --  3.7  --    GFR Estimated Creatinine Clearance: 70.7 mL/min (by C-G formula based on SCr of 0.98 mg/dL). Liver Function Tests: Recent Labs  Lab 06/10/23 2039 06/11/23 1045  06/12/23 0542  AST 32 24 19  ALT 28 23 19   ALKPHOS 91 73 68  BILITOT 1.2 1.1 1.0  PROT 7.6 6.7 6.1*  ALBUMIN 3.8 3.3* 3.0*   Recent Labs  Lab 06/10/23 2039 06/12/23 0542  LIPASE 503* 58*   No results for input(s): "AMMONIA" in the last 168 hours. Coagulation profile No results for input(s): "INR", "PROTIME" in the last 168 hours. COVID-19 Labs  No results for input(s): "DDIMER", "FERRITIN", "LDH", "CRP" in the last 72 hours.  No  results found for: "SARSCOV2NAA"  CBC: Recent Labs  Lab 06/10/23 2039 06/11/23 1045 06/12/23 0542  WBC 7.9 6.9 5.6  NEUTROABS  --  4.4  --   HGB 16.2* 14.9 14.3  HCT 49.0* 45.8 44.0  MCV 88.6 91.1 90.7  PLT 222 188 174   Cardiac Enzymes: No results for input(s): "CKTOTAL", "CKMB", "CKMBINDEX", "TROPONINI" in the last 168 hours. BNP (last 3 results) No results for input(s): "PROBNP" in the last 8760 hours. CBG: No results for input(s): "GLUCAP" in the last 168 hours. D-Dimer: No results for input(s): "DDIMER" in the last 72 hours. Hgb A1c: No results for input(s): "HGBA1C" in the last 72 hours. Lipid Profile: No results for input(s): "CHOL", "HDL", "LDLCALC", "TRIG", "CHOLHDL", "LDLDIRECT" in the last 72 hours. Thyroid function studies: No results for input(s): "TSH", "T4TOTAL", "T3FREE", "THYROIDAB" in the last 72 hours.  Invalid input(s): "FREET3" Anemia work up: No results for input(s): "VITAMINB12", "FOLATE", "FERRITIN", "TIBC", "IRON", "RETICCTPCT" in the last 72 hours. Sepsis Labs: Recent Labs  Lab 06/10/23 2039 06/11/23 1045 06/12/23 0542  WBC 7.9 6.9 5.6   Microbiology No results found for this or any previous visit (from the past 240 hour(s)).   Medications:    aspirin EC  81 mg Oral Daily   carvedilol  25 mg Oral BID WC   enoxaparin (LOVENOX) injection  40 mg Subcutaneous Q24H   levothyroxine  175 mcg Oral QAC breakfast   sacubitril-valsartan  1 tablet Oral BID   Continuous Infusions:  0.9 % NaCl with KCl 20 mEq / L 75 mL/hr at 06/12/23 0319   cefTRIAXone (ROCEPHIN)  IV Stopped (06/11/23 1231)   metronidazole 500 mg (06/12/23 0031)      LOS: 1 day   Marinda Elk  [facility]  06/12/2023, 7:52 AM

## 2023-06-12 NOTE — TOC CM/SW Note (Signed)
Transition of Care [facility]) - Inpatient Brief Assessment   Patient Details  Name: Kelly Frank MRN: [id number] Date of Birth: 14-Oct-1961  Transition of Care [facility]) CM/SW Contact:    Otelia Santee, LCSW Phone Number: 06/12/2023, 11:33 AM   Clinical Narrative: Chart reviewed. No TOC needs identified at this time.    Transition of Care Asessment: Insurance and Status: Insurance coverage has been reviewed Patient has primary care physician: Yes Home environment has been reviewed: Home alone Prior level of function:: Independent Prior/Current Home Services: No current home services Social Determinants of Health Reivew: SDOH reviewed no interventions necessary Readmission risk has been reviewed: Yes Transition of care needs: no transition of care needs at this time

## 2023-06-12 NOTE — Anesthesia Postprocedure Evaluation (Signed)
Anesthesia Post Note  Patient: [name]  Procedure(s) Performed: ESOPHAGOGASTRODUODENOSCOPY (EGD) WITH PROPOFOL     Patient location during evaluation: PACU Anesthesia Type: MAC Level of consciousness: awake Pain management: pain level controlled Vital Signs Assessment: post-procedure vital signs reviewed and stable Respiratory status: spontaneous breathing, nonlabored ventilation and respiratory function stable Cardiovascular status: stable and blood pressure returned to baseline Postop Assessment: no apparent nausea or vomiting Anesthetic complications: no   No notable events documented.  Last Vitals:  Vitals:   06/12/23 1432 06/12/23 1440  BP: 118/70 118/70  Pulse: (!) 59 64  Resp: 19 15  Temp: 36.4 C 36.4 C  SpO2: 96% 96%    Last Pain:  Vitals:   06/12/23 1440  TempSrc: Temporal  PainSc: 0-No pain                 Linton Rump

## 2023-06-12 NOTE — Anesthesia Procedure Notes (Signed)
Procedure Name: MAC Date/Time: 06/12/2023 2:05 PM  Performed by: Orest Dikes, CRNAPre-anesthesia Checklist: Patient identified, Emergency Drugs available, Suction available and Patient being monitored Oxygen Delivery Method: Simple face mask

## 2023-06-12 NOTE — Progress Notes (Signed)
Kelly Frank 2:04 PM  Subjective: Patient doing well surgical note noted and MRCP reviewed and she has no complaints no pain and is n.p.o. and case discussed with her sister as well  Objective: Vital signs stable afebrile no acute distress exam please see preassessment evaluation chemistry is okay CBC okay MRCP reviewed  Assessment: Abnormal ampulla  Plan: The risk benefits methods of endoscopy and possibly even using the side-viewing scope was discussed with the patient and we will proceed today with anesthesia assistance with further workup and plans pending those findings  [facility] E  office [phone] After 5PM or if no answer call [phone]

## 2023-06-13 DIAGNOSIS — R1084 Generalized abdominal pain: Secondary | ICD-10-CM | POA: Diagnosis not present

## 2023-06-13 DIAGNOSIS — K810 Acute cholecystitis: Secondary | ICD-10-CM | POA: Diagnosis not present

## 2023-06-13 DIAGNOSIS — K838 Other specified diseases of biliary tract: Secondary | ICD-10-CM | POA: Diagnosis not present

## 2023-06-13 DIAGNOSIS — R932 Abnormal findings on diagnostic imaging of liver and biliary tract: Secondary | ICD-10-CM | POA: Diagnosis not present

## 2023-06-13 MED ORDER — PANTOPRAZOLE SODIUM 40 MG PO TBEC: 40.0000 mg | DELAYED_RELEASE_TABLET | Freq: Two times a day (BID) | ORAL | 3 refills | Status: AC

## 2023-06-13 NOTE — Progress Notes (Addendum)
1 Day Post-Op  Subjective: Eating well today.  No complaints.  Pain resolved.  Endo/ERCP results noted from yesterday  ROS: See above, otherwise other systems negative  Objective: Vital signs in last 24 hours: Temp:  [97.6 F (36.4 C)-98.3 F (36.8 C)] 97.9 F (36.6 C) (06/14 0537) Pulse Rate:  [49-71] 49 (06/14 0537) Resp:  [13-20] 15 (06/14 0537) BP: (105-169)/(52-92) 137/83 (06/14 0537) SpO2:  [96 %-100 %] 96 % (06/14 0537) Last BM Date : 06/11/23  Intake/Output from previous day: 06/13 0701 - 06/14 0700 In: 1139.3 [I.V.:741.2; IV Piggyback:398] Out: -  Intake/Output this shift: No intake/output data recorded.  PE: Abd: soft, NT, ND, +BS  Lab Results:  Recent Labs    06/11/23 1045 06/12/23 0542  WBC 6.9 5.6  HGB 14.9 14.3  HCT 45.8 44.0  PLT 188 174   BMET Recent Labs    06/11/23 1045 06/12/23 0542  NA 137 137  K 3.9 3.9  CL 103 103  CO2 24 22  GLUCOSE 88 70  BUN 13 17  CREATININE 1.00 0.98  CALCIUM 8.8* 8.5*   PT/INR No results for input(s): "LABPROT", "INR" in the last 72 hours. CMP     Component Value Date/Time   NA 137 06/12/2023 0542   K 3.9 06/12/2023 0542   CL 103 06/12/2023 0542   CO2 22 06/12/2023 0542   GLUCOSE 70 06/12/2023 0542   BUN 17 06/12/2023 0542   CREATININE 0.98 06/12/2023 0542   CALCIUM 8.5 (L) 06/12/2023 0542   PROT 6.1 (L) 06/12/2023 0542   ALBUMIN 3.0 (L) 06/12/2023 0542   AST 19 06/12/2023 0542   ALT 19 06/12/2023 0542   ALKPHOS 68 06/12/2023 0542   BILITOT 1.0 06/12/2023 0542   GFRNONAA >60 06/12/2023 0542   GFRAA 55 (L) 04/19/2017 0215   Lipase     Component Value Date/Time   LIPASE 58 (H) 06/12/2023 0542       Studies/Results: ECHOCARDIOGRAM COMPLETE  Result Date: 06/12/2023    ECHOCARDIOGRAM REPORT   Patient Name:   [facility] A Cortese Date of Exam: 06/12/2023 Medical Rec #:  [id number]    Height:       67.0 in Accession #:    [id number]   Weight:       211.0 lb Date of Birth:  1961-12-14    BSA:           2.069 m Patient Age:    62 years     BP:           137/84 mmHg Patient Gender: F            HR:           62 bpm. Exam Location:  Inpatient Procedure: 2D Echo, Cardiac Doppler and Color Doppler Indications:    Congestive HEart Failure  History:        Patient has no prior history of Echocardiogram examinations.                 Cardiomyopathy and CHF; Risk Factors:Hypertension, Sleep Apnea                 and Current Smoker.  Sonographer:    Wallie Char Referring Phys: [id number] DAVID MANUEL ORTIZ IMPRESSIONS  1. Left ventricular ejection fraction, by estimation, is 40 to 45%. The left ventricle has mildly decreased function. The left ventricle demonstrates global hypokinesis. The left ventricular internal cavity size was mildly to moderately dilated. Left ventricular diastolic parameters are consistent  with Grade I diastolic dysfunction (impaired relaxation).  2. Right ventricular systolic function is normal. The right ventricular size is normal.  3. Left atrial size was mildly dilated.  4. The mitral valve is normal in structure. Trivial mitral valve regurgitation. No evidence of mitral stenosis.  5. The aortic valve is normal in structure. There is mild calcification of the aortic valve. Aortic valve regurgitation is not visualized. No aortic stenosis is present.  6. The inferior vena cava is normal in size with greater than 50% respiratory variability, suggesting right atrial pressure of 3 mmHg. FINDINGS  Left Ventricle: Left ventricular ejection fraction, by estimation, is 40 to 45%. The left ventricle has mildly decreased function. The left ventricle demonstrates global hypokinesis. The left ventricular internal cavity size was mildly to moderately dilated. There is no left ventricular hypertrophy. Left ventricular diastolic parameters are consistent with Grade I diastolic dysfunction (impaired relaxation). Right Ventricle: The right ventricular size is normal. No increase in right ventricular wall  thickness. Right ventricular systolic function is normal. Left Atrium: Left atrial size was mildly dilated. Right Atrium: Right atrial size was normal in size. Pericardium: There is no evidence of pericardial effusion. Mitral Valve: The mitral valve is normal in structure. Trivial mitral valve regurgitation. No evidence of mitral valve stenosis. MV peak gradient, 2.5 mmHg. The mean mitral valve gradient is 1.0 mmHg. Tricuspid Valve: The tricuspid valve is normal in structure. Tricuspid valve regurgitation is trivial. No evidence of tricuspid stenosis. Aortic Valve: The aortic valve is normal in structure. There is mild calcification of the aortic valve. Aortic valve regurgitation is not visualized. No aortic stenosis is present. Aortic valve mean gradient measures 3.0 mmHg. Aortic valve peak gradient measures 6.5 mmHg. Aortic valve area, by VTI measures 3.10 cm. Pulmonic Valve: The pulmonic valve was normal in structure. Pulmonic valve regurgitation is not visualized. No evidence of pulmonic stenosis. Aorta: The aortic root is normal in size and structure. Venous: The inferior vena cava is normal in size with greater than 50% respiratory variability, suggesting right atrial pressure of 3 mmHg. IAS/Shunts: There is right bowing of the interatrial septum, suggestive of elevated left atrial pressure. No atrial level shunt detected by color flow Doppler.  LEFT VENTRICLE PLAX 2D LVIDd:         6.00 cm      Diastology LVIDs:         4.40 cm      LV e' medial:    3.48 cm/s LV PW:         1.00 cm      LV E/e' medial:  11.8 LV IVS:        0.90 cm      LV e' lateral:   3.40 cm/s LVOT diam:     2.20 cm      LV E/e' lateral: 12.0 LV SV:         78 LV SV Index:   38 LVOT Area:     3.80 cm  LV Volumes (MOD) LV vol d, MOD A2C: 95.8 ml LV vol d, MOD A4C: 121.0 ml LV vol s, MOD A2C: 51.9 ml LV vol s, MOD A4C: 65.2 ml LV SV MOD A2C:     43.9 ml LV SV MOD A4C:     121.0 ml LV SV MOD BP:      49.5 ml RIGHT VENTRICLE             IVC  RV Basal diam:  3.40 cm  IVC diam: 1.30 cm RV S prime:     17.90 cm/s TAPSE (M-mode): 2.9 cm LEFT ATRIUM             Index        RIGHT ATRIUM           Index LA diam:        4.60 cm 2.22 cm/m   RA Area:     14.30 cm LA Vol (A2C):   49.9 ml 24.12 ml/m  RA Volume:   34.00 ml  16.43 ml/m LA Vol (A4C):   55.6 ml 26.88 ml/m LA Biplane Vol: 52.9 ml 25.57 ml/m  AORTIC VALVE AV Area (Vmax):    3.19 cm AV Area (Vmean):   3.00 cm AV Area (VTI):     3.10 cm AV Vmax:           127.00 cm/s AV Vmean:          85.750 cm/s AV VTI:            0.252 m AV Peak Grad:      6.5 mmHg AV Mean Grad:      3.0 mmHg LVOT Vmax:         106.50 cm/s LVOT Vmean:        67.650 cm/s LVOT VTI:          0.206 m LVOT/AV VTI ratio: 0.82  AORTA Ao Root diam: 3.20 cm Ao Asc diam:  3.00 cm MITRAL VALVE MV Area (PHT): 2.82 cm    SHUNTS MV Area VTI:   4.06 cm    Systemic VTI:  0.21 m MV Peak grad:  2.5 mmHg    Systemic Diam: 2.20 cm MV Mean grad:  1.0 mmHg MV Vmax:       0.80 m/s MV Vmean:      39.1 cm/s MV Decel Time: 269 msec MV E velocity: 40.90 cm/s MV A velocity: 92.10 cm/s MV E/A ratio:  0.44 Arvilla Meres MD Electronically signed by Arvilla Meres MD Signature Date/Time: 06/12/2023/11:30:48 AM    Final    MR ABDOMEN MRCP W WO CONTAST  Result Date: 06/12/2023 CLINICAL DATA:  62 year old female with history of right upper quadrant abdominal pain. EXAM: MRI ABDOMEN WITHOUT AND WITH CONTRAST (INCLUDING MRCP) TECHNIQUE: Multiplanar multisequence MR imaging of the abdomen was performed both before and after the administration of intravenous contrast. Heavily T2-weighted images of the biliary and pancreatic ducts were obtained, and three-dimensional MRCP images were rendered by post processing. CONTRAST:  10mL GADAVIST GADOBUTROL 1 MMOL/ML IV SOLN COMPARISON:  No prior abdominal MRI. CT of the abdomen and pelvis 06/10/2023. Abdominal ultrasound 06/11/2023. FINDINGS: Lower chest: Mild cardiomegaly. Hepatobiliary: No suspicious cystic or  solid hepatic lesions. Gallbladder is moderately distended. Gallbladder wall appears mildly thickened and edematous with trace volume of pericholecystic fluid. No definite well-defined filling defect noted within the lumen of the gallbladder to suggest choledocholithiasis. However, there does appear to be a small amount of amorphous T1 hyperintensity lying dependently in the gallbladder, which could indicate biliary sludge. Some MRCP images are limited by considerable patient motion. However, with this limitation in mind, there is no intrahepatic biliary ductal dilatation. Common bile duct is mildly dilated measuring up to 11 mm in the porta hepatis. Possible prolapse of the distal common bile duct and ampulla into the second portion of the duodenum, best appreciated on coronal T2 image 13 of series 6 and coronal image 30 of series 23, which demonstrates some focal narrowing of the distal  common bile duct in this region to proximally 2 mm in diameter. There is some mild enhancement of the distal common bile duct at the level of the ampulla (axial image 61 of series 21), without a well-defined mass. Pancreas: No pancreatic mass. No pancreatic ductal dilatation. No pancreatic or peripancreatic fluid collections or inflammatory changes. Spleen:  Unremarkable. Adrenals/Urinary Tract: Bilateral kidneys and bilateral adrenal glands are normal in appearance. No hydroureteronephrosis in the visualized portions of the abdomen. Stomach/Bowel: Visualized portions are unremarkable. Vascular/Lymphatic: No aneurysm identified in the visualized abdominal vasculature. No lymphadenopathy noted in the abdomen. Other:  No significant volume of ascites. Musculoskeletal: No aggressive appearing osseous lesions are noted in the visualized portions of the skeleton. IMPRESSION: 1. Moderate gallbladder distention with mild gallbladder wall thickening and edema, and trace volume of pericholecystic fluid. Although there are no definite  gallstones, there does appear to be a small amount of biliary sludge lying dependently in the gallbladder. Further clinical evaluation for signs and symptoms of acute cholecystitis is recommended. 2. Unusual appearance of the distal common bile duct, with apparent prolapse of the distal common bile duct and ampulla into the second portion of the duodenal. No discrete ampullary mass confidently identified, although the ampulla does demonstrate subtle enhancement. This results in waist like narrowing of the distal common bile duct just proximal to the prolapse segment where the lumen is narrowed to only 2 mm, associated with mild proximal common bile duct dilatation (11 mm). Further evaluation with endoscopic ultrasound and possible ERCP should be considered if clinically appropriate. 3. No choledocholithiasis. 4. Mild cardiomegaly. Electronically Signed   By: Trudie Reed M.D.   On: 06/12/2023 09:02   MR 3D Recon At Scanner  Result Date: 06/12/2023 CLINICAL DATA:  62 year old female with history of right upper quadrant abdominal pain. EXAM: MRI ABDOMEN WITHOUT AND WITH CONTRAST (INCLUDING MRCP) TECHNIQUE: Multiplanar multisequence MR imaging of the abdomen was performed both before and after the administration of intravenous contrast. Heavily T2-weighted images of the biliary and pancreatic ducts were obtained, and three-dimensional MRCP images were rendered by post processing. CONTRAST:  10mL GADAVIST GADOBUTROL 1 MMOL/ML IV SOLN COMPARISON:  No prior abdominal MRI. CT of the abdomen and pelvis 06/10/2023. Abdominal ultrasound 06/11/2023. FINDINGS: Lower chest: Mild cardiomegaly. Hepatobiliary: No suspicious cystic or solid hepatic lesions. Gallbladder is moderately distended. Gallbladder wall appears mildly thickened and edematous with trace volume of pericholecystic fluid. No definite well-defined filling defect noted within the lumen of the gallbladder to suggest choledocholithiasis. However, there does  appear to be a small amount of amorphous T1 hyperintensity lying dependently in the gallbladder, which could indicate biliary sludge. Some MRCP images are limited by considerable patient motion. However, with this limitation in mind, there is no intrahepatic biliary ductal dilatation. Common bile duct is mildly dilated measuring up to 11 mm in the porta hepatis. Possible prolapse of the distal common bile duct and ampulla into the second portion of the duodenum, best appreciated on coronal T2 image 13 of series 6 and coronal image 30 of series 23, which demonstrates some focal narrowing of the distal common bile duct in this region to proximally 2 mm in diameter. There is some mild enhancement of the distal common bile duct at the level of the ampulla (axial image 61 of series 21), without a well-defined mass. Pancreas: No pancreatic mass. No pancreatic ductal dilatation. No pancreatic or peripancreatic fluid collections or inflammatory changes. Spleen:  Unremarkable. Adrenals/Urinary Tract: Bilateral kidneys and bilateral adrenal glands are normal  in appearance. No hydroureteronephrosis in the visualized portions of the abdomen. Stomach/Bowel: Visualized portions are unremarkable. Vascular/Lymphatic: No aneurysm identified in the visualized abdominal vasculature. No lymphadenopathy noted in the abdomen. Other:  No significant volume of ascites. Musculoskeletal: No aggressive appearing osseous lesions are noted in the visualized portions of the skeleton. IMPRESSION: 1. Moderate gallbladder distention with mild gallbladder wall thickening and edema, and trace volume of pericholecystic fluid. Although there are no definite gallstones, there does appear to be a small amount of biliary sludge lying dependently in the gallbladder. Further clinical evaluation for signs and symptoms of acute cholecystitis is recommended. 2. Unusual appearance of the distal common bile duct, with apparent prolapse of the distal common bile  duct and ampulla into the second portion of the duodenal. No discrete ampullary mass confidently identified, although the ampulla does demonstrate subtle enhancement. This results in waist like narrowing of the distal common bile duct just proximal to the prolapse segment where the lumen is narrowed to only 2 mm, associated with mild proximal common bile duct dilatation (11 mm). Further evaluation with endoscopic ultrasound and possible ERCP should be considered if clinically appropriate. 3. No choledocholithiasis. 4. Mild cardiomegaly. Electronically Signed   By: Trudie Reed M.D.   On: 06/12/2023 09:02    Anti-infectives: Anti-infectives (From admission, onward)    Start     Dose/Rate Route Frequency Ordered Stop   06/11/23 1200  cefTRIAXone (ROCEPHIN) 2 g in sodium chloride 0.9 % 100 mL IVPB        2 g 200 mL/hr over 30 Minutes Intravenous Every 24 hours 06/11/23 1026     06/11/23 1200  metroNIDAZOLE (FLAGYL) IVPB 500 mg        500 mg 100 mL/hr over 60 Minutes Intravenous Every 12 hours 06/11/23 1026          Assessment/Plan Biliary pancreatitis secondary to sludge -eating solid diet and tolerating it well with no recurrent pain -patient wishes to not proceed with surgery at this time.  Discussed possible recurrence with patient.  Offered follow up with our office if she desires surgery moving forward. -pancreatitis resolved -EGD/ERCP with no ampullary lesion or any other concerning findings -no further abx warranted  FEN - soft VTE - Lovenox ID - Rocephin, no further abx therapy warranted  CHF - unknown EF, on Entresto, echo pending Hypothyroidism  HTN  I reviewed Consultant GI notes, hospitalist notes, last 24 h vitals and pain scores, last 48 h intake and output, last 24 h labs and trends, and last 24 h imaging results.   LOS: 2 days    Letha Cape , [facility] 06/13/2023, 8:57 AM Please see Amion for pager number during day hours 7:00am-4:30pm  or 7:00am -11:30am on weekends

## 2023-06-13 NOTE — Discharge Summary (Signed)
Physician Discharge Summary  Kelly Frank ZOX:[id number] DOB: 11-23-1961 DOA: 06/10/2023  PCP: [facility]  Admit date: 06/10/2023 Discharge date: 06/13/2023  Admitted From: Home Disposition:  Home  Recommendations for Outpatient Follow-up:  Follow up with PCP in 1-2 weeks Please obtain BMP/CBC in one week   Home Health:No Equipment/Devices:None  Discharge Condition:Stable CODE STATUS:Full Diet recommendation: Heart Healthy   Brief/Interim Summary: 62 y.o. female past medical history significant for essential hypertension, chronic combined systolic and diastolic heart failure obstructive sleep apnea on CPAP comes into the emergency room with sudden onset right upper quadrant pain that started 3 days prior to admission, decreased oral intake.  CT scan of the abdomen pelvis showed distended gallbladder with pericholecystic inflammation with dilated CBD, GI was consulted    Discharge Diagnoses:  Principal Problem:   Acute acalculous cholecystitis Active Problems:   Tobacco abuse   Essential hypertension   OSA on CPAP   Dilated cardiomyopathy (HCC)   Elevated lipase   Chronic combined systolic and diastolic congestive heart failure (HCC)   Class 1 obesity  Acute acalculous cholecystitis: General surgery was consulted, patient relates she would like to hold on on her lap chole. She was treated with antiemetics and her pain improved. General surgery will follow-up with her as an outpatient.  Mild acute gastritis: She was started on Protonix and her abdominal pain improved.  Chronic combined systolic and diastolic heart failure: No changes made to her medication continue Coreg Entresto and furosemide.  Tobacco abuse: She has been counseled.  Essential hypertension: No changes made to her medication.  Likely sleep apnea: Continue CPAP at night.  Hypokalemia: Repleted now resolved.  Discharge Instructions  Discharge Instructions     Diet - low sodium  heart healthy   Complete by: As directed    Increase activity slowly   Complete by: As directed       Allergies as of 06/13/2023   No Known Allergies      Medication List     TAKE these medications    albuterol 108 (90 Base) MCG/ACT inhaler Commonly known as: VENTOLIN HFA Inhale 2 puffs into the lungs as needed for wheezing or shortness of breath.   aspirin EC 81 MG tablet Take 81 mg by mouth daily.   carvedilol 25 MG tablet Commonly known as: COREG Take 25 mg by mouth 2 (two) times daily with a meal.   Entresto 97-103 MG Generic drug: sacubitril-valsartan Take 1 tablet by mouth 2 (two) times daily.   furosemide 20 MG tablet Commonly known as: LASIX Take 20 mg by mouth as needed for fluid or edema.   levothyroxine 175 MCG tablet Commonly known as: SYNTHROID Take 175 mcg by mouth daily.   pantoprazole 40 MG tablet Commonly known as: PROTONIX Take 1 tablet (40 mg total) by mouth 2 (two) times daily.        Follow-up Information     [facility] .   Contact information: [phone] [address] [place] [address] [phone]         [facility] up.   Specialty: General Surgery Why: As needed for gallbladder symptoms Contact information: [address] [place] [address] [phone]                No Known Allergies  Consultations: General surgery GI.   Procedures/Studies: ECHOCARDIOGRAM COMPLETE  Result Date: 06/12/2023    ECHOCARDIOGRAM REPORT   Patient Name:   Kelly Frank Date of  Exam: 06/12/2023 Medical Rec #:  [id number]    Height:       67.0 in Accession #:    [id number]   Weight:       211.0 lb Date of Birth:  01-Sep-1961    BSA:          2.069 m Patient Age:    62 years     BP:           137/84 mmHg Patient Gender: F            HR:           62 bpm. Exam Location:  Inpatient Procedure: 2D Echo, Cardiac Doppler and Color Doppler Indications:    Congestive HEart Failure  History:         Patient has no prior history of Echocardiogram examinations.                 Cardiomyopathy and CHF; Risk Factors:Hypertension, Sleep Apnea                 and Current Smoker.  Sonographer:    Wallie Char Referring Phys: [id number] DAVID MANUEL ORTIZ IMPRESSIONS  1. Left ventricular ejection fraction, by estimation, is 40 to 45%. The left ventricle has mildly decreased function. The left ventricle demonstrates global hypokinesis. The left ventricular internal cavity size was mildly to moderately dilated. Left ventricular diastolic parameters are consistent with Grade I diastolic dysfunction (impaired relaxation).  2. Right ventricular systolic function is normal. The right ventricular size is normal.  3. Left atrial size was mildly dilated.  4. The mitral valve is normal in structure. Trivial mitral valve regurgitation. No evidence of mitral stenosis.  5. The aortic valve is normal in structure. There is mild calcification of the aortic valve. Aortic valve regurgitation is not visualized. No aortic stenosis is present.  6. The inferior vena cava is normal in size with greater than 50% respiratory variability, suggesting right atrial pressure of 3 mmHg. FINDINGS  Left Ventricle: Left ventricular ejection fraction, by estimation, is 40 to 45%. The left ventricle has mildly decreased function. The left ventricle demonstrates global hypokinesis. The left ventricular internal cavity size was mildly to moderately dilated. There is no left ventricular hypertrophy. Left ventricular diastolic parameters are consistent with Grade I diastolic dysfunction (impaired relaxation). Right Ventricle: The right ventricular size is normal. No increase in right ventricular wall thickness. Right ventricular systolic function is normal. Left Atrium: Left atrial size was mildly dilated. Right Atrium: Right atrial size was normal in size. Pericardium: There is no evidence of pericardial effusion. Mitral Valve: The mitral valve is normal in  structure. Trivial mitral valve regurgitation. No evidence of mitral valve stenosis. MV peak gradient, 2.5 mmHg. The mean mitral valve gradient is 1.0 mmHg. Tricuspid Valve: The tricuspid valve is normal in structure. Tricuspid valve regurgitation is trivial. No evidence of tricuspid stenosis. Aortic Valve: The aortic valve is normal in structure. There is mild calcification of the aortic valve. Aortic valve regurgitation is not visualized. No aortic stenosis is present. Aortic valve mean gradient measures 3.0 mmHg. Aortic valve peak gradient measures 6.5 mmHg. Aortic valve area, by VTI measures 3.10 cm. Pulmonic Valve: The pulmonic valve was normal in structure. Pulmonic valve regurgitation is not visualized. No evidence of pulmonic stenosis. Aorta: The aortic root is normal in size and structure. Venous: The inferior vena cava is normal in size with greater than 50% respiratory variability, suggesting right atrial pressure of 3 mmHg. IAS/Shunts: There is  right bowing of the interatrial septum, suggestive of elevated left atrial pressure. No atrial level shunt detected by color flow Doppler.  LEFT VENTRICLE PLAX 2D LVIDd:         6.00 cm      Diastology LVIDs:         4.40 cm      LV e' medial:    3.48 cm/s LV PW:         1.00 cm      LV E/e' medial:  11.8 LV IVS:        0.90 cm      LV e' lateral:   3.40 cm/s LVOT diam:     2.20 cm      LV E/e' lateral: 12.0 LV SV:         78 LV SV Index:   38 LVOT Area:     3.80 cm  LV Volumes (MOD) LV vol d, MOD A2C: 95.8 ml LV vol d, MOD A4C: 121.0 ml LV vol s, MOD A2C: 51.9 ml LV vol s, MOD A4C: 65.2 ml LV SV MOD A2C:     43.9 ml LV SV MOD A4C:     121.0 ml LV SV MOD BP:      49.5 ml RIGHT VENTRICLE             IVC RV Basal diam:  3.40 cm     IVC diam: 1.30 cm RV S prime:     17.90 cm/s TAPSE (M-mode): 2.9 cm LEFT ATRIUM             Index        RIGHT ATRIUM           Index LA diam:        4.60 cm 2.22 cm/m   RA Area:     14.30 cm LA Vol (A2C):   49.9 ml 24.12 ml/m  RA  Volume:   34.00 ml  16.43 ml/m LA Vol (A4C):   55.6 ml 26.88 ml/m LA Biplane Vol: 52.9 ml 25.57 ml/m  AORTIC VALVE AV Area (Vmax):    3.19 cm AV Area (Vmean):   3.00 cm AV Area (VTI):     3.10 cm AV Vmax:           127.00 cm/s AV Vmean:          85.750 cm/s AV VTI:            0.252 m AV Peak Grad:      6.5 mmHg AV Mean Grad:      3.0 mmHg LVOT Vmax:         106.50 cm/s LVOT Vmean:        67.650 cm/s LVOT VTI:          0.206 m LVOT/AV VTI ratio: 0.82  AORTA Ao Root diam: 3.20 cm Ao Asc diam:  3.00 cm MITRAL VALVE MV Area (PHT): 2.82 cm    SHUNTS MV Area VTI:   4.06 cm    Systemic VTI:  0.21 m MV Peak grad:  2.5 mmHg    Systemic Diam: 2.20 cm MV Mean grad:  1.0 mmHg MV Vmax:       0.80 m/s MV Vmean:      39.1 cm/s MV Decel Time: 269 msec MV E velocity: 40.90 cm/s MV A velocity: 92.10 cm/s MV E/A ratio:  0.44 Arvilla Meres MD Electronically signed by Arvilla Meres MD Signature Date/Time: 06/12/2023/11:30:48 AM    Final    MR  ABDOMEN MRCP W WO CONTAST  Result Date: 06/12/2023 CLINICAL DATA:  62 year old female with history of right upper quadrant abdominal pain. EXAM: MRI ABDOMEN WITHOUT AND WITH CONTRAST (INCLUDING MRCP) TECHNIQUE: Multiplanar multisequence MR imaging of the abdomen was performed both before and after the administration of intravenous contrast. Heavily T2-weighted images of the biliary and pancreatic ducts were obtained, and three-dimensional MRCP images were rendered by post processing. CONTRAST:  10mL GADAVIST GADOBUTROL 1 MMOL/ML IV SOLN COMPARISON:  No prior abdominal MRI. CT of the abdomen and pelvis 06/10/2023. Abdominal ultrasound 06/11/2023. FINDINGS: Lower chest: Mild cardiomegaly. Hepatobiliary: No suspicious cystic or solid hepatic lesions. Gallbladder is moderately distended. Gallbladder wall appears mildly thickened and edematous with trace volume of pericholecystic fluid. No definite well-defined filling defect noted within the lumen of the gallbladder to suggest  choledocholithiasis. However, there does appear to be a small amount of amorphous T1 hyperintensity lying dependently in the gallbladder, which could indicate biliary sludge. Some MRCP images are limited by considerable patient motion. However, with this limitation in mind, there is no intrahepatic biliary ductal dilatation. Common bile duct is mildly dilated measuring up to 11 mm in the porta hepatis. Possible prolapse of the distal common bile duct and ampulla into the second portion of the duodenum, best appreciated on coronal T2 image 13 of series 6 and coronal image 30 of series 23, which demonstrates some focal narrowing of the distal common bile duct in this region to proximally 2 mm in diameter. There is some mild enhancement of the distal common bile duct at the level of the ampulla (axial image 61 of series 21), without a well-defined mass. Pancreas: No pancreatic mass. No pancreatic ductal dilatation. No pancreatic or peripancreatic fluid collections or inflammatory changes. Spleen:  Unremarkable. Adrenals/Urinary Tract: Bilateral kidneys and bilateral adrenal glands are normal in appearance. No hydroureteronephrosis in the visualized portions of the abdomen. Stomach/Bowel: Visualized portions are unremarkable. Vascular/Lymphatic: No aneurysm identified in the visualized abdominal vasculature. No lymphadenopathy noted in the abdomen. Other:  No significant volume of ascites. Musculoskeletal: No aggressive appearing osseous lesions are noted in the visualized portions of the skeleton. IMPRESSION: 1. Moderate gallbladder distention with mild gallbladder wall thickening and edema, and trace volume of pericholecystic fluid. Although there are no definite gallstones, there does appear to be a small amount of biliary sludge lying dependently in the gallbladder. Further clinical evaluation for signs and symptoms of acute cholecystitis is recommended. 2. Unusual appearance of the distal common bile duct, with  apparent prolapse of the distal common bile duct and ampulla into the second portion of the duodenal. No discrete ampullary mass confidently identified, although the ampulla does demonstrate subtle enhancement. This results in waist like narrowing of the distal common bile duct just proximal to the prolapse segment where the lumen is narrowed to only 2 mm, associated with mild proximal common bile duct dilatation (11 mm). Further evaluation with endoscopic ultrasound and possible ERCP should be considered if clinically appropriate. 3. No choledocholithiasis. 4. Mild cardiomegaly. Electronically Signed   By: Trudie Reed M.D.   On: 06/12/2023 09:02   MR 3D Recon At Scanner  Result Date: 06/12/2023 CLINICAL DATA:  62 year old female with history of right upper quadrant abdominal pain. EXAM: MRI ABDOMEN WITHOUT AND WITH CONTRAST (INCLUDING MRCP) TECHNIQUE: Multiplanar multisequence MR imaging of the abdomen was performed both before and after the administration of intravenous contrast. Heavily T2-weighted images of the biliary and pancreatic ducts were obtained, and three-dimensional MRCP images were rendered by post  processing. CONTRAST:  10mL GADAVIST GADOBUTROL 1 MMOL/ML IV SOLN COMPARISON:  No prior abdominal MRI. CT of the abdomen and pelvis 06/10/2023. Abdominal ultrasound 06/11/2023. FINDINGS: Lower chest: Mild cardiomegaly. Hepatobiliary: No suspicious cystic or solid hepatic lesions. Gallbladder is moderately distended. Gallbladder wall appears mildly thickened and edematous with trace volume of pericholecystic fluid. No definite well-defined filling defect noted within the lumen of the gallbladder to suggest choledocholithiasis. However, there does appear to be a small amount of amorphous T1 hyperintensity lying dependently in the gallbladder, which could indicate biliary sludge. Some MRCP images are limited by considerable patient motion. However, with this limitation in mind, there is no  intrahepatic biliary ductal dilatation. Common bile duct is mildly dilated measuring up to 11 mm in the porta hepatis. Possible prolapse of the distal common bile duct and ampulla into the second portion of the duodenum, best appreciated on coronal T2 image 13 of series 6 and coronal image 30 of series 23, which demonstrates some focal narrowing of the distal common bile duct in this region to proximally 2 mm in diameter. There is some mild enhancement of the distal common bile duct at the level of the ampulla (axial image 61 of series 21), without a well-defined mass. Pancreas: No pancreatic mass. No pancreatic ductal dilatation. No pancreatic or peripancreatic fluid collections or inflammatory changes. Spleen:  Unremarkable. Adrenals/Urinary Tract: Bilateral kidneys and bilateral adrenal glands are normal in appearance. No hydroureteronephrosis in the visualized portions of the abdomen. Stomach/Bowel: Visualized portions are unremarkable. Vascular/Lymphatic: No aneurysm identified in the visualized abdominal vasculature. No lymphadenopathy noted in the abdomen. Other:  No significant volume of ascites. Musculoskeletal: No aggressive appearing osseous lesions are noted in the visualized portions of the skeleton. IMPRESSION: 1. Moderate gallbladder distention with mild gallbladder wall thickening and edema, and trace volume of pericholecystic fluid. Although there are no definite gallstones, there does appear to be a small amount of biliary sludge lying dependently in the gallbladder. Further clinical evaluation for signs and symptoms of acute cholecystitis is recommended. 2. Unusual appearance of the distal common bile duct, with apparent prolapse of the distal common bile duct and ampulla into the second portion of the duodenal. No discrete ampullary mass confidently identified, although the ampulla does demonstrate subtle enhancement. This results in waist like narrowing of the distal common bile duct just  proximal to the prolapse segment where the lumen is narrowed to only 2 mm, associated with mild proximal common bile duct dilatation (11 mm). Further evaluation with endoscopic ultrasound and possible ERCP should be considered if clinically appropriate. 3. No choledocholithiasis. 4. Mild cardiomegaly. Electronically Signed   By: Trudie Reed M.D.   On: 06/12/2023 09:02   US Abdomen Limited RUQ (LIVER/GB)  Result Date: 06/11/2023 CLINICAL DATA:  Abnormal CT. EXAM: ULTRASOUND ABDOMEN LIMITED RIGHT UPPER QUADRANT COMPARISON:  CT with IV contrast yesterday, showing gallbladder thickening and pericholecystic fluid with a prominent common bile duct with questionable ampullary region mass. FINDINGS: Gallbladder: Portions of the gallbladder free wall are prominent measuring up to 4 mm in thickness and there is a small amount of pericholecystic fluid. There was no positive sonographic Murphy's sign, but the patient had been pain medicated prior to the study. There is hypoechoic layering sludge in the gallbladder but no shadowing stones. CT and ultrasound findings are concerning for acalculous cholecystitis. Common bile duct: Diameter: Prominent measuring 11 mm, with mild intrahepatic biliary prominence. Liver: No focal lesion identified. Within normal limits in parenchymal echogenicity. Portal vein is patent  on color Doppler imaging with normal direction of blood flow towards the liver. Other: None. IMPRESSION: 1. Gallbladder wall thickening and pericholecystic fluid with layering sludge in the gallbladder. Findings are concerning for acalculous cholecystitis. Sonographic Murphy's sign unable to be assessed due to the patient's pain medicated state. 2. Prominent common bile duct with mild intrahepatic biliary prominence. Etiology indeterminate. Electronically Signed   By: Almira Bar M.D.   On: 06/11/2023 02:17   CT ABDOMEN PELVIS W CONTRAST  Result Date: 06/11/2023 CLINICAL DATA:  Abdominal pain. EXAM: CT  ABDOMEN AND PELVIS WITH CONTRAST TECHNIQUE: Multidetector CT imaging of the abdomen and pelvis was performed using the standard protocol following bolus administration of intravenous contrast. RADIATION DOSE REDUCTION: This exam was performed according to the departmental dose-optimization program which includes automated exposure control, adjustment of the mA and/or kV according to patient size and/or use of iterative reconstruction technique. CONTRAST:  OMNIPAQUE IOHEXOL 300 MG/ML  SOLN COMPARISON:  None Available. FINDINGS: Lower chest: Mild lingular and bibasilar atelectasis is seen. Hepatobiliary: No focal liver abnormality is seen. No gallstones are seen within a moderately distended gallbladder. Mild pericholecystic inflammation is noted. The common bile duct measures 8.8 mm in diameter, with an 18 mm x 17 mm x 17 mm area of low attenuation seen within the region of the ampulla (axial CT image 32, CT series 3). Pancreas: Unremarkable. No pancreatic ductal dilatation or surrounding inflammatory changes. Spleen: Normal in size without focal abnormality. Adrenals/Urinary Tract: Adrenal glands are unremarkable. Kidneys are normal, without renal calculi, focal lesion, or hydronephrosis. Bladder is unremarkable. Stomach/Bowel: Stomach is within normal limits. Appendix appears normal. No evidence of bowel wall thickening, distention, or inflammatory changes. Vascular/Lymphatic: Aortic atherosclerosis. No enlarged abdominal or pelvic lymph nodes. Reproductive: Status post hysterectomy. No adnexal masses. Other: No abdominal wall hernia or abnormality. There is a small amount of posterior pelvic free fluid. Musculoskeletal: Multilevel degenerative changes are seen within the lumbar spine, most prominent at the level of L5-S1. IMPRESSION: 1. Moderately distended gallbladder with mild pericholecystic inflammation, which may represent sequelae associated with acalculous cholecystitis. Correlation with gallbladder  ultrasound is recommended. 2. Mild dilatation of the common bile duct with an area of low attenuation seen within the region of the ampulla. Correlation with ERCP is recommended to exclude the presence of an underlying neoplastic process. 3. Small amount of posterior pelvic free fluid. 4. Evidence of prior hysterectomy. 5. Aortic atherosclerosis. Aortic Atherosclerosis (ICD10-I70.0). Electronically Signed   By: Aram Candela M.D.   On: 06/11/2023 00:01   (Echo, Carotid, EGD, Colonoscopy, ERCP)    Subjective: No complaints  Discharge Exam: Vitals:   06/12/23 1918 06/13/23 0537  BP: 136/88 137/83  Pulse: 61 (!) 49  Resp: 20 15  Temp: 98 F (36.7 C) 97.9 F (36.6 C)  SpO2: 96% 96%   Vitals:   06/12/23 1449 06/12/23 1517 06/12/23 1918 06/13/23 0537  BP: (!) 148/90 (!) 169/69 136/88 137/83  Pulse: 60 (!) 52 61 (!) 49  Resp: 17 20 20 15   Temp:  98 F (36.7 C) 98 F (36.7 C) 97.9 F (36.6 C)  TempSrc: Temporal  Oral Oral  SpO2: 96% 98% 96% 96%  Weight:      Height:        General: Pt is alert, awake, not in acute distress Cardiovascular: RRR, S1/S2 +, no rubs, no gallops Respiratory: CTA bilaterally, no wheezing, no rhonchi Abdominal: Soft, NT, ND, bowel sounds + Extremities: no edema, no cyanosis    The  results of significant diagnostics from this hospitalization (including imaging, microbiology, ancillary and laboratory) are listed below for reference.     Microbiology: No results found for this or any previous visit (from the past 240 hour(s)).   Labs: BNP (last 3 results) No results for input(s): "BNP" in the last 8760 hours. Basic Metabolic Panel: Recent Labs  Lab 06/10/23 2039 06/11/23 1045 06/12/23 0542  NA 135 137 137  K 3.2* 3.9 3.9  CL 100 103 103  CO2 24 24 22   GLUCOSE 111* 88 70  BUN 11 13 17   CREATININE 0.99 1.00 0.98  CALCIUM 8.7* 8.8* 8.5*  MG  --  1.9  --   PHOS  --  3.7  --    Liver Function Tests: Recent Labs  Lab 06/10/23 2039  06/11/23 1045 06/12/23 0542  AST 32 24 19  ALT 28 23 19   ALKPHOS 91 73 68  BILITOT 1.2 1.1 1.0  PROT 7.6 6.7 6.1*  ALBUMIN 3.8 3.3* 3.0*   Recent Labs  Lab 06/10/23 2039 06/12/23 0542  LIPASE 503* 58*   No results for input(s): "AMMONIA" in the last 168 hours. CBC: Recent Labs  Lab 06/10/23 2039 06/11/23 1045 06/12/23 0542  WBC 7.9 6.9 5.6  NEUTROABS  --  4.4  --   HGB 16.2* 14.9 14.3  HCT 49.0* 45.8 44.0  MCV 88.6 91.1 90.7  PLT 222 188 174   Cardiac Enzymes: No results for input(s): "CKTOTAL", "CKMB", "CKMBINDEX", "TROPONINI" in the last 168 hours. BNP: Invalid input(s): "POCBNP" CBG: No results for input(s): "GLUCAP" in the last 168 hours. D-Dimer No results for input(s): "DDIMER" in the last 72 hours. Hgb A1c No results for input(s): "HGBA1C" in the last 72 hours. Lipid Profile No results for input(s): "CHOL", "HDL", "LDLCALC", "TRIG", "CHOLHDL", "LDLDIRECT" in the last 72 hours. Thyroid function studies No results for input(s): "TSH", "T4TOTAL", "T3FREE", "THYROIDAB" in the last 72 hours.  Invalid input(s): "FREET3" Anemia work up No results for input(s): "VITAMINB12", "FOLATE", "FERRITIN", "TIBC", "IRON", "RETICCTPCT" in the last 72 hours. Urinalysis    Component Value Date/Time   COLORURINE YELLOW 06/10/2023 0818   APPEARANCEUR CLEAR 06/10/2023 0818   LABSPEC 1.020 06/10/2023 0818   PHURINE 5.5 06/10/2023 0818   GLUCOSEU NEGATIVE 06/10/2023 0818   HGBUR NEGATIVE 06/10/2023 0818   BILIRUBINUR SMALL (A) 06/10/2023 0818   KETONESUR 15 (A) 06/10/2023 0818   PROTEINUR NEGATIVE 06/10/2023 0818   NITRITE NEGATIVE 06/10/2023 0818   LEUKOCYTESUR NEGATIVE 06/10/2023 0818   Sepsis Labs Recent Labs  Lab 06/10/23 2039 06/11/23 1045 06/12/23 0542  WBC 7.9 6.9 5.6   Microbiology No results found for this or any previous visit (from the past 240 hour(s)).   SIGNED:   Marinda Elk, MD  [facility] 06/13/2023, 9:01 AM Pager   If  7PM-7AM, please contact night-coverage [email or web address] Password [email or web address]

## 2023-06-16 ENCOUNTER — Encounter (HOSPITAL_COMMUNITY): Payer: Self-pay | Admitting: Gastroenterology

## 2023-07-09 IMAGING — MG MM DIGITAL SCREENING BILAT W/ TOMO AND CAD
8 series · 8 of 24 positions shown · non-contrast
Comparison: Previous exam(s).

CLINICAL DATA: Screening.

EXAM:
DIGITAL SCREENING BILATERAL MAMMOGRAM WITH TOMOSYNTHESIS AND CAD
TECHNIQUE: Bilateral screening digital craniocaudal and mediolateral oblique
mammograms were obtained. Bilateral screening digital breast
tomosynthesis was performed. The images were evaluated with
computer-aided detection.

[L CC synth-2D]
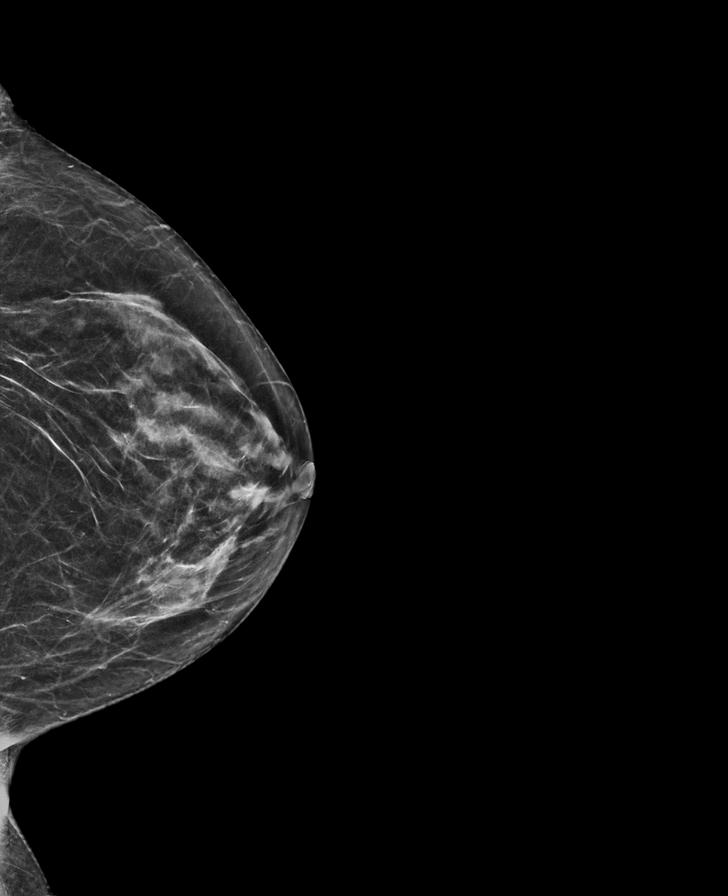

[R CC synth-2D]
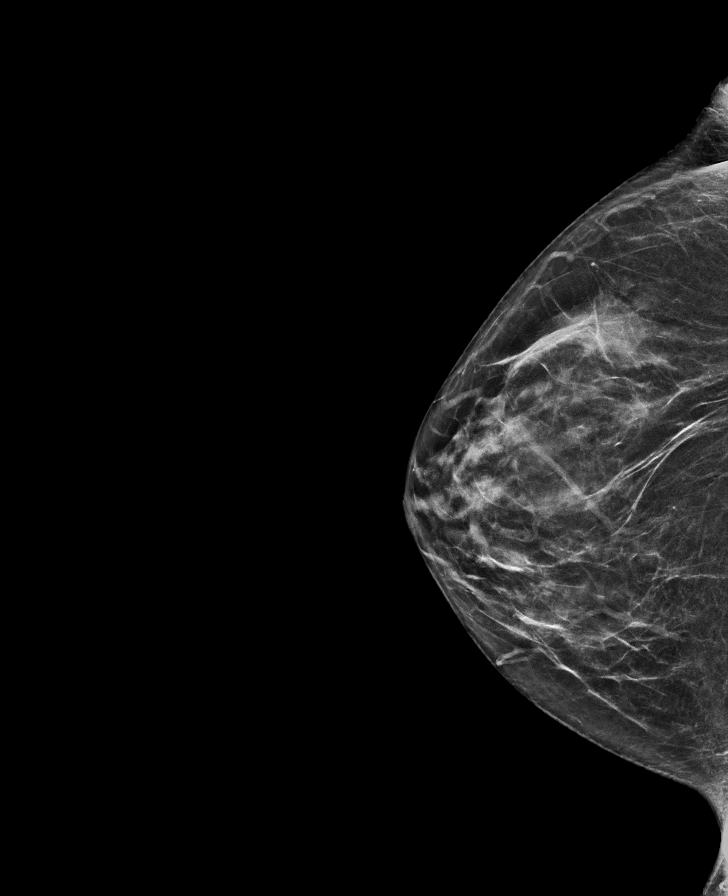

[R MLO synth-2D]
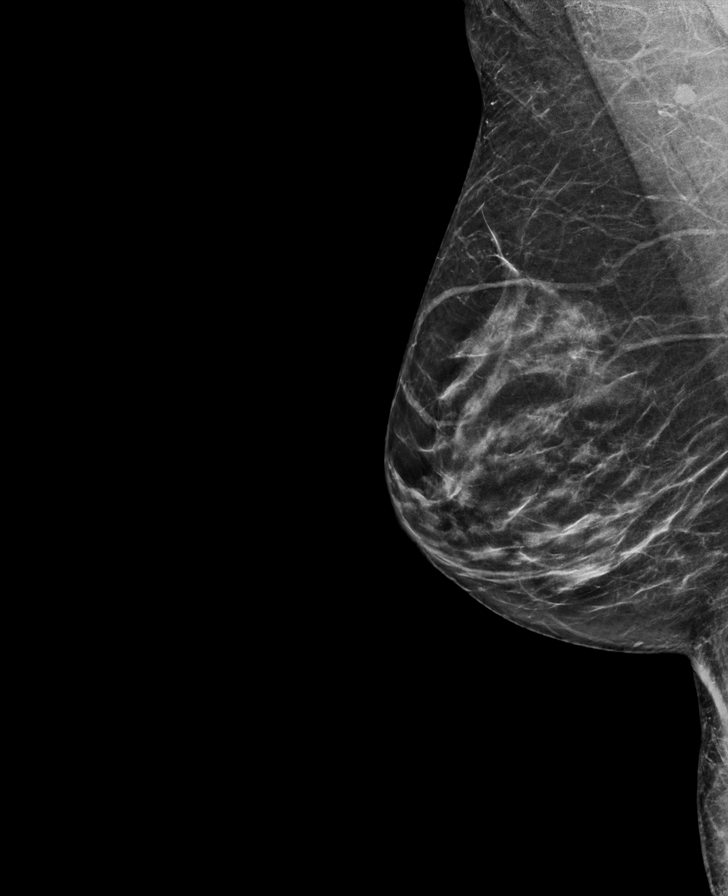

[L MLO synth-2D]
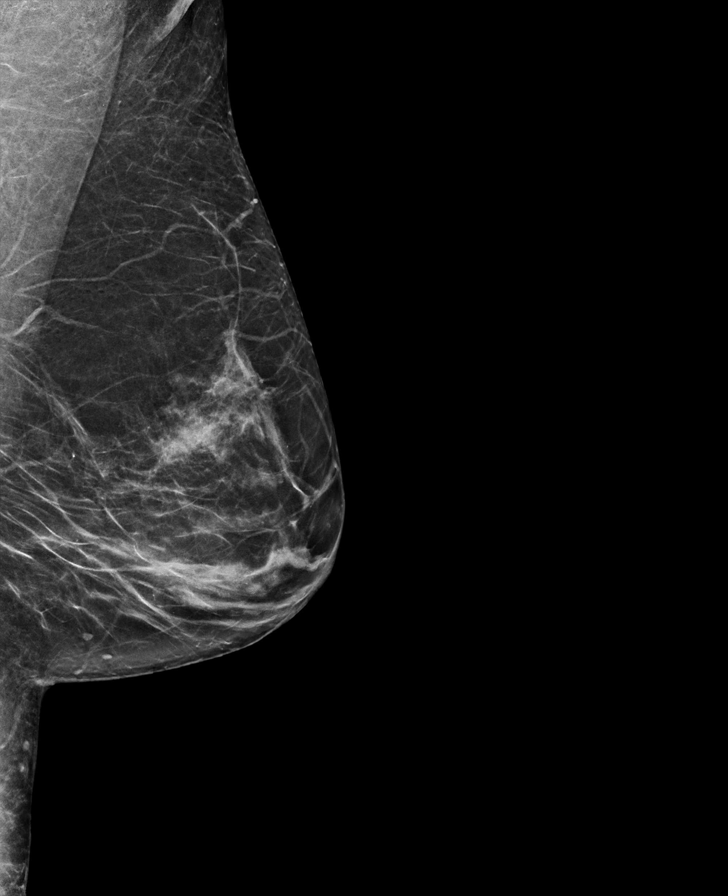

[R MLO tomo · tomo slice 33/64.0]
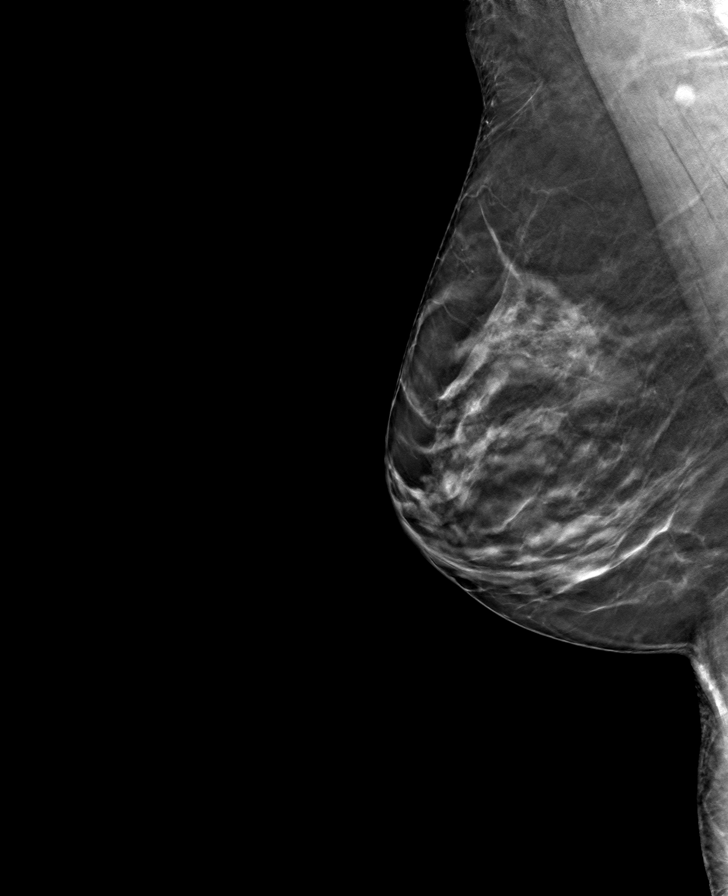

[L CC tomo · tomo slice 32/63.0]
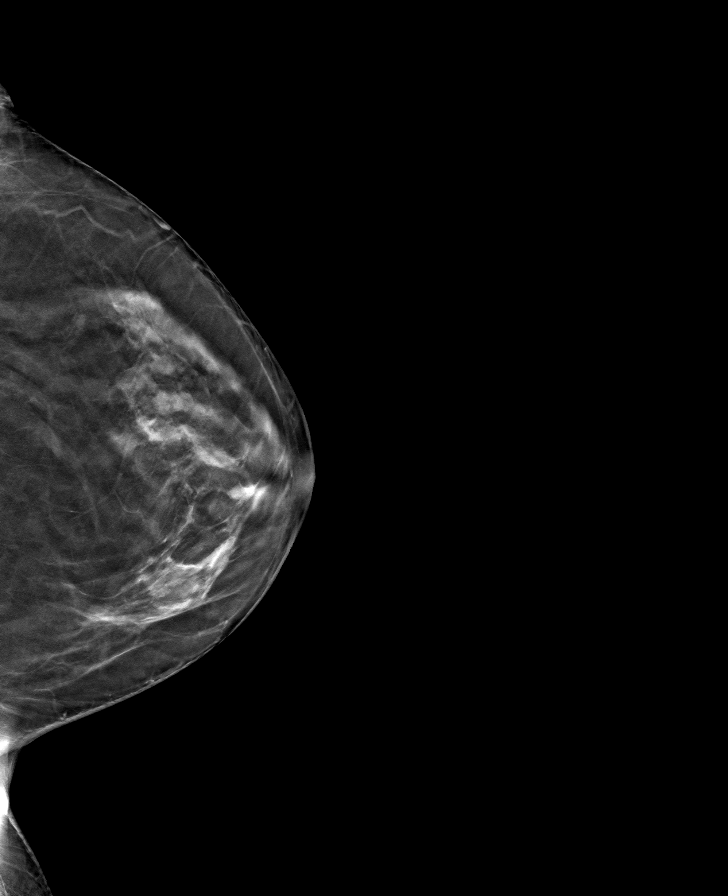

[R CC tomo · tomo slice 33/64.0]
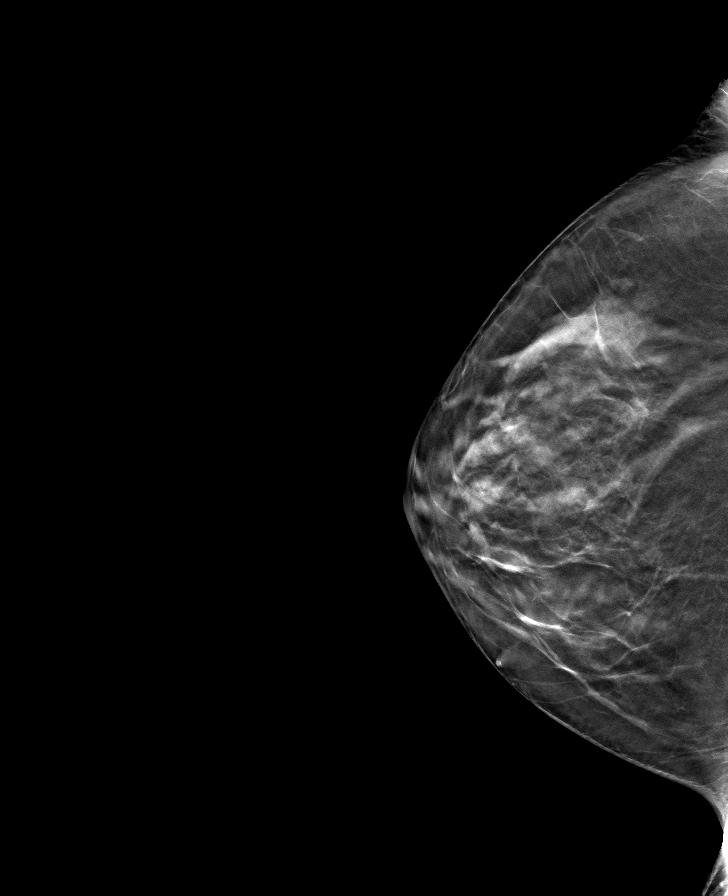

[L MLO tomo · tomo slice 31/62.0]
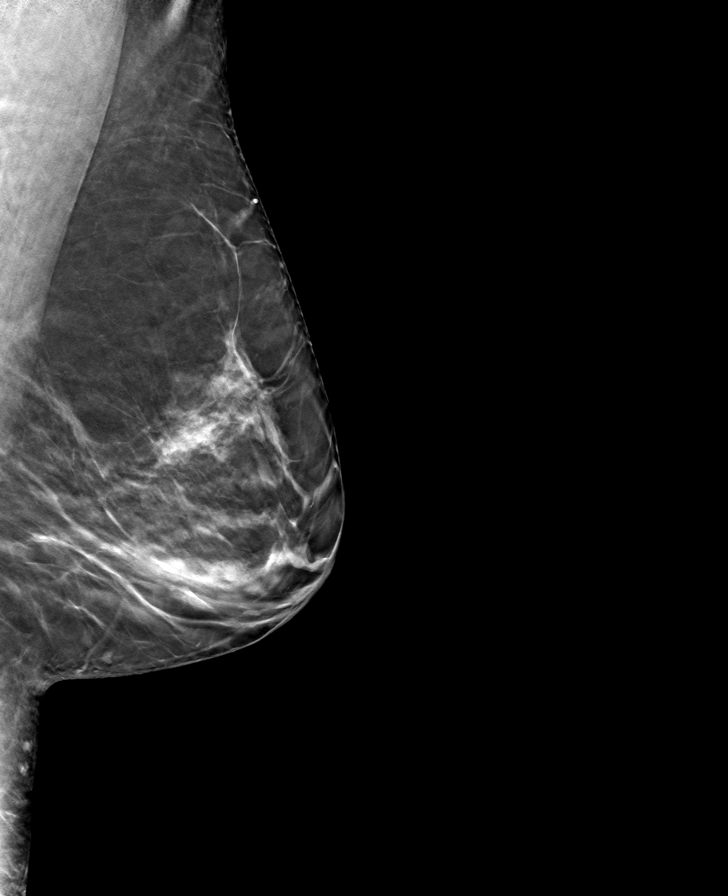

[8 of 24 positions shown; findings below may reference images not displayed]

ACR Breast Density Category c: The breast tissue is heterogeneously
dense, which may obscure small masses.
FINDINGS: There are no findings suspicious for malignancy.
IMPRESSION: No mammographic evidence of malignancy. A result letter of this
screening mammogram will be mailed directly to the patient.

RECOMMENDATION:
Screening mammogram in one year. (Code:Q3-W-BC3)

BI-RADS CATEGORY  1: Negative.

## 2023-08-25 ENCOUNTER — Ambulatory Visit: Payer: Federal, State, Local not specified - PPO

## 2023-08-25 VITALS — BP 123/82 | HR 60 | Ht 67.0 in | Wt 221.2 lb

## 2023-08-25 DIAGNOSIS — Z01419 Encounter for gynecological examination (general) (routine) without abnormal findings: Secondary | ICD-10-CM | POA: Diagnosis not present

## 2023-08-25 NOTE — Assessment & Plan Note (Signed)
-   Reviewed health maintenance topics as documented below. - Pap smear in 2023, NILM without HPV co-testing. Recommend repeat in 2026 per ASCCP guidelines. - Mammogram ordered today. - Follows with PCP for other preventative health care screening.

## 2023-08-25 NOTE — Progress Notes (Signed)
Outpatient Gynecology Note: Annual Visit  Assessment/Plan:    Kelly Frank is a 62 y.o. female G1P1 with normal well-woman gynecologic exam.   Well woman exam - Reviewed health maintenance topics as documented below. - Pap smear in 09-29-2022, NILM without HPV co-testing. Recommend repeat in 09-29-25 per ASCCP guidelines. - Mammogram ordered today. - Follows with PCP for other preventative health care screening.     Orders Placed This Encounter  Procedures   MM 3D SCREENING MAMMOGRAM BILATERAL BREAST    Standing Status:   Future    Standing Expiration Date:   08/24/2024    Order Specific Question:   Reason for Exam (SYMPTOM  OR DIAGNOSIS REQUIRED)    Answer:   screening    Order Specific Question:   Preferred imaging location?    Answer:   [facility]   Current Outpatient Medications  Medication Instructions   albuterol (VENTOLIN HFA) 108 (90 Base) MCG/ACT inhaler 2 puffs, Inhalation, As needed   aspirin EC 81 mg, Oral, Daily   carvedilol (COREG) 25 mg, Oral, 2 times daily with meals   ENTRESTO 97-103 MG 1 tablet, Oral, 2 times daily   furosemide (LASIX) 20 mg, Oral, As needed   levothyroxine (SYNTHROID) 175 mcg, Oral, Daily   pantoprazole (PROTONIX) 40 mg, Oral, 2 times daily    Return in about 1 year (around 08/24/2024) for Annual physical.    Subjective:    Kelly Frank is a 62 y.o. female G1P1 who presents for annual wellness visit.   Occupation works Radiographer, therapeutic.    Lives alone.     CONCERNS? none  Well Woman Visit:  GYN HISTORY:  No LMP recorded. Patient has had a hysterectomy.     Menstrual History: OB History     Gravida  1   Para  1   Term      Preterm      AB      Living         SAB      IAB      Ectopic      Multiple      Live Births           Obstetric Comments  1st Menstrual Cycle:   1st Pregnancy:  17         No LMP recorded. Patient has had a hysterectomy. Unsure of age at time of hysterectomy due to fibroids   Urinary  incontinence? no  Sexually active: yes Number of sexual partners: one Gender of sexual Partners: male Dyspareunia? no Last pap: 2022-09-29, NILM History of abnormal Pap: no STI history: no Contraceptive methods: hysterectomy.  Health Maintenance > Reviewed breast self-awareness, sister died from breast cancer in 1996/09/29. > History of abnormal mammogram: No > Exercise: walking and weightlifting, very active > Dietary Supplements: multivitamin every few days > Body mass index is 34.64 kg/m. : > Recent dental visit Yes.   > Seat Belt Use: Yes.   > Texting and driving? No. > Guns in the house Yes.  Gun safe > STI/HIV testing or immunizations needed? No. > Concern for alcohol abuse? Two in a month   Tobacco or other drug use: Smokes daily Tobacco Use: High Risk (08/25/2023)   Patient History    Smoking Tobacco Use: Every Day    Smokeless Tobacco Use: Never    Passive Exposure: Not on file     PHQ-2 Score: In last two weeks, how often have you felt: Little interest or  pleasure in doing things: Several days (+1) Feeling down, depressed or hopeless: Several days (+1) Score: 2  If >50:  Last mammogram:  BIRADS I, 2/23 Age at menopause: Last colonoscopy: 12/14/13, fecal occult blood negative in 2020 Last lipid screening: Follows with PCP   _________________________________________________________  Current Outpatient Medications  Medication Sig Dispense Refill   albuterol (VENTOLIN HFA) 108 (90 Base) MCG/ACT inhaler Inhale 2 puffs into the lungs as needed for wheezing or shortness of breath.     aspirin EC 81 MG tablet Take 81 mg by mouth daily.     carvedilol (COREG) 25 MG tablet Take 25 mg by mouth 2 (two) times daily with a meal.     ENTRESTO 97-103 MG Take 1 tablet by mouth 2 (two) times daily.     furosemide (LASIX) 20 MG tablet Take 20 mg by mouth as needed for fluid or edema.  0   levothyroxine (SYNTHROID) 175 MCG tablet Take 175 mcg by mouth daily.     pantoprazole  (PROTONIX) 40 MG tablet Take 1 tablet (40 mg total) by mouth 2 (two) times daily. 60 tablet 3   No current facility-administered medications for this visit.   No Known Allergies  Past Medical History:  Diagnosis Date   Dilated cardiomyopathy (HCC) 06/23/2017   DOE (dyspnea on exertion)    Essential hypertension 04/29/2017   Hypertension    OSA on CPAP 06/23/2017   Rotator cuff impingement syndrome 12/12/2011   Thyroid disease    Tobacco abuse 04/29/2017   Past Surgical History:  Procedure Laterality Date   ABDOMINAL HYSTERECTOMY     ESOPHAGOGASTRODUODENOSCOPY (EGD) WITH PROPOFOL N/A 06/12/2023   Procedure: ESOPHAGOGASTRODUODENOSCOPY (EGD) WITH PROPOFOL;  Surgeon: Vida Rigger, MD;  Location: [facility];  Service: Gastroenterology;  Laterality: N/A;   SHOULDER SURGERY  2000   left reconstructive    SHOULDER SURGERY Left    OB History     Gravida  1   Para  1   Term      Preterm      AB      Living         SAB      IAB      Ectopic      Multiple      Live Births           Obstetric Comments  1st Menstrual Cycle:   1st Pregnancy:  72        Social History   Tobacco Use   Smoking status: Every Day    Current packs/day: 0.50    Average packs/day: 0.5 packs/day for 25.0 years (12.5 ttl pk-yrs)    Types: Cigarettes   Smokeless tobacco: Never  Substance Use Topics   Alcohol use: Yes    Comment: occasionally   Social History   Substance and Sexual Activity  Sexual Activity Yes   Birth control/protection: Surgical    Immunization History  Administered Date(s) Administered   Influenza-Unspecified 11/11/2020   PFIZER(Purple Top)SARS-COV-2 Vaccination 03/30/2020, 04/24/2020     Review Of Systems  Constitutional: Denied constitutional symptoms, night sweats, recent illness, fatigue, fever, insomnia and weight loss.  Eyes: Denied eye symptoms, eye pain, photophobia, vision change and visual disturbance.  Ears/Nose/Throat/Neck: Denied ear,  nose, throat or neck symptoms, hearing loss, nasal discharge, sinus congestion and sore throat.  Cardiovascular: Denied cardiovascular symptoms, arrhythmia, chest pain/pressure, edema, exercise intolerance, orthopnea and palpitations.  Respiratory: Denied pulmonary symptoms, asthma, pleuritic pain, productive sputum, cough, dyspnea and wheezing.  Gastrointestinal: Denied,  gastro-esophageal reflux, melena, nausea and vomiting.  Genitourinary: Denied genitourinary symptoms including symptomatic vaginal discharge, pelvic relaxation issues, and urinary complaints.  Musculoskeletal: Denied musculoskeletal symptoms, stiffness, swelling, muscle weakness and myalgia.  Dermatologic: Denied dermatology symptoms, rash and scar.  Neurologic: Denied neurology symptoms, dizziness, headache, neck pain and syncope.  Psychiatric: Denied psychiatric symptoms, anxiety and depression.  Endocrine: Denied endocrine symptoms including hot flashes and night sweats.      Objective:    BP 123/82   Pulse 60   Ht 5\' 7"  (1.702 m)   Wt 221 lb 3.2 oz (100.3 kg)   BMI 34.64 kg/m   Constitutional: Well-developed, well-nourished female in no acute distress Neurological: Alert and oriented to person, place, and time Psychiatric: Mood and affect appropriate Skin: No rashes or lesions Neck: Supple without masses. Trachea is midline.Thyroid is normal size without masses Lymphatics: No cervical, axillary, supraclavicular, or inguinal adenopathy noted Respiratory: Clear to auscultation bilaterally. Good air movement with normal work of breathing. Cardiovascular: Regular rate and rhythm. Extremities grossly normal, nontender with no edema; pulses regular Gastrointestinal: Soft, nontender, nondistended. No masses or hernias appreciated. No hepatosplenomegaly. No fluid wave. No rebound or guarding. Breast Exam: normal appearance, no masses or tenderness Genitourinary: deferred   Rectal: deferred    Autumn Messing, CNM   08/25/23 2:07 PM

## 2023-09-18 ENCOUNTER — Ambulatory Visit
Admission: RE | Admit: 2023-09-18 | Discharge: 2023-09-18 | Disposition: A | Discharge status: Home / Self Care | Payer: Federal, State, Local not specified - PPO | Source: Ambulatory Visit

## 2023-09-18 DIAGNOSIS — Z1231 Encounter for screening mammogram for malignant neoplasm of breast: Secondary | ICD-10-CM | POA: Diagnosis present

## 2023-09-18 DIAGNOSIS — Z01419 Encounter for gynecological examination (general) (routine) without abnormal findings: Secondary | ICD-10-CM

## 2024-08-23 ENCOUNTER — Telehealth: Payer: Self-pay | Admitting: Obstetrics

## 2024-08-23 NOTE — Telephone Encounter (Signed)
 Patient called and wanted to know if you would put in the order for her Mammogram as well.  I explained to her , that you may want to see her before you do that.  Please Advise.  Charlene

## 2024-09-02 NOTE — Progress Notes (Deleted)
 ANNUAL PREVENTATIVE CARE GYNECOLOGY  ENCOUNTER NOTE  SUBJECTIVE:       Kelly Frank is a 63 y.o. G1P1 female here for a routine annual gynecologic exam. The patient {is/is not/has never been:13135} sexually active. The patient {is/is not:13135} taking hormone replacement therapy. {post-men bleed:13152::Patient denies post-menopausal vaginal bleeding.} Family history of breast, uterine, ovarian cancer: {yes/no:311178}. The patient wears seatbelts: {yes/no:311178}. The patient participates in regular exercise: {yes/no/not asked:9010}. Has the patient ever been transfused or tattooed?: {yes/no/not asked:9010}. The patient reports that there {is/is not:9024} domestic violence in her life. Has the patient completed the Gardasil vaccine? {yes/no:311178}.  Current complaints: 1.  ***    Gynecologic History No LMP recorded. Patient has had a hysterectomy. Contraception: {method:5051} Last Pap: ***. Results were: {norm/abn:16337} History of abnormal pap: *** History of STIs: *** Last Mammogram: ***. Results were: {norm/abn:16337} Last Colonoscopy:  Last Dexa Scan:   PHQ-2:      No data to display          Obstetric History OB History  Gravida Para Term Preterm AB Living  1 1      SAB IAB Ectopic Multiple Live Births          # Outcome Date GA Lbr Len/2nd Weight Sex Type Anes PTL Lv  1 Para             Obstetric Comments  1st Menstrual Cycle:    1st Pregnancy:  17    Past Medical History:  Diagnosis Date   Dilated cardiomyopathy (HCC) 06/23/2017   DOE (dyspnea on exertion)    Essential hypertension 04/29/2017   Hypertension    OSA on CPAP 06/23/2017   Rotator cuff impingement syndrome 12/12/2011   Thyroid disease    Tobacco abuse 04/29/2017    Family History  Problem Relation Age of Onset   Diabetes Mother    Hypertension Mother    Breast cancer Sister 46   Heart attack Neg Hx    Hyperlipidemia Neg Hx    Sudden death Neg Hx     Past Surgical History:   Procedure Laterality Date   ABDOMINAL HYSTERECTOMY     ESOPHAGOGASTRODUODENOSCOPY (EGD) WITH PROPOFOL  N/A 06/12/2023   Procedure: ESOPHAGOGASTRODUODENOSCOPY (EGD) WITH PROPOFOL ;  Surgeon: Rosalie Kitchens, MD;  Location: [facility];  Service: Gastroenterology;  Laterality: N/A;   SHOULDER SURGERY  2000   left reconstructive    SHOULDER SURGERY Left     Social History   Socioeconomic History   Marital status: Divorced    Spouse name: Not on file   Number of children: Not on file   Years of education: Not on file   Highest education level: Not on file  Occupational History   Not on file  Tobacco Use   Smoking status: Every Day    Current packs/day: 0.50    Average packs/day: 0.5 packs/day for 25.0 years (12.5 ttl pk-yrs)    Types: Cigarettes   Smokeless tobacco: Never  Vaping Use   Vaping status: Some Days  Substance and Sexual Activity   Alcohol use: Yes    Comment: occasionally   Drug use: No   Sexual activity: Yes    Birth control/protection: Surgical  Other Topics Concern   Not on file  Social History Narrative   Not on file   Social Drivers of Health   Financial Resource Strain: Not on file  Food Insecurity: No Food Insecurity (06/11/2023)   Hunger Vital Sign    Worried About Running Out of Food in the  Last Year: Never true    Ran Out of Food in the Last Year: Never true  Transportation Needs: No Transportation Needs (06/11/2023)   PRAPARE - Administrator, Civil Service (Medical): No    Lack of Transportation (Non-Medical): No  Physical Activity: Not on file  Stress: Not on file  Social Connections: Not on file  Intimate Partner Violence: Not At Risk (06/11/2023)   Humiliation, Afraid, Rape, and Kick questionnaire    Fear of Current or Ex-Partner: No    Emotionally Abused: No    Physically Abused: No    Sexually Abused: No    Current Outpatient Medications on File Prior to Visit  Medication Sig Dispense Refill   albuterol (VENTOLIN HFA) 108 (90  Base) MCG/ACT inhaler Inhale 2 puffs into the lungs as needed for wheezing or shortness of breath.     aspirin  EC 81 MG tablet Take 81 mg by mouth daily.     carvedilol  (COREG ) 25 MG tablet Take 25 mg by mouth 2 (two) times daily with a meal.     ENTRESTO  97-103 MG Take 1 tablet by mouth 2 (two) times daily.     furosemide  (LASIX ) 20 MG tablet Take 20 mg by mouth as needed for fluid or edema.  0   levothyroxine  (SYNTHROID ) 175 MCG tablet Take 175 mcg by mouth daily.     pantoprazole  (PROTONIX ) 40 MG tablet Take 1 tablet (40 mg total) by mouth 2 (two) times daily. 60 tablet 3   No current facility-administered medications on file prior to visit.    No Known Allergies   Review of Systems ROS Review of Systems - General ROS: negative for - chills, fatigue, fever, hot flashes, night sweats, weight gain or weight loss Psychological ROS: negative for - anxiety, decreased libido, depression, mood swings, physical abuse or sexual abuse Ophthalmic ROS: negative for - blurry vision, eye pain or loss of vision ENT ROS: negative for - headaches, hearing change, visual changes or vocal changes Allergy and Immunology ROS: negative for - hives, itchy/watery eyes or seasonal allergies Hematological and Lymphatic ROS: negative for - bleeding problems, bruising, swollen lymph nodes or weight loss Endocrine ROS: negative for - galactorrhea, hair pattern changes, hot flashes, malaise/lethargy, mood swings, palpitations, polydipsia/polyuria, skin changes, temperature intolerance or unexpected weight changes Breast ROS: negative for - new or changing breast lumps or nipple discharge Respiratory ROS: negative for - cough or shortness of breath Cardiovascular ROS: negative for - chest pain, irregular heartbeat, palpitations or shortness of breath Gastrointestinal ROS: no abdominal pain, change in bowel habits, or black or bloody stools Genito-Urinary ROS: no dysuria, trouble voiding, or hematuria Musculoskeletal  ROS: negative for - joint pain or joint stiffness Neurological ROS: negative for - bowel and bladder control changes Dermatological ROS: negative for rash and skin lesion changes   OBJECTIVE:   There were no vitals taken for this visit.  CONSTITUTIONAL: Well-developed, well-nourished female in no acute distress.  PSYCHIATRIC: Normal mood and affect. Normal behavior. Normal judgment and thought content. NEUROLGIC: Alert and oriented to person, place, and time. Normal muscle tone coordination. No cranial nerve deficit noted. HENT:  Normocephalic, atraumatic, External right and left ear normal. Oropharynx is clear and moist EYES: Conjunctivae and EOM are normal. No scleral icterus.  NECK: Normal range of motion, supple, no masses.  Normal thyroid.  SKIN: Skin is warm and dry. No rash noted. Not diaphoretic. No erythema. No pallor. CARDIOVASCULAR: Normal heart rate noted, regular rhythm, no murmur. RESPIRATORY:  Clear to auscultation bilaterally. Effort and breath sounds normal, no problems with respiration noted. BREASTS: Symmetric in size. No masses, skin changes, nipple drainage, or lymphadenopathy. ABDOMEN: Soft, normal bowel sounds, no distention noted.  No tenderness, rebound or guarding.  PELVIC:  Bladder {:311640}  Urethra: {:311719}  Vulva: {:311722}  Vagina: {:311643}  Cervix: {:311644}  Uterus: {:311718}  Adnexa: {:311645}  RV: {Blank multiple:19196::External Exam NormaI,No Rectal Masses,Normal Sphincter tone}  MUSCULOSKELETAL: Normal range of motion. No tenderness.  No cyanosis, clubbing, or edema.  2+ distal pulses. LYMPHATIC: No Axillary, Supraclavicular, or Inguinal Adenopathy.  Labs: Lab Results  Component Value Date   WBC 5.6 06/12/2023   HGB 14.3 06/12/2023   HCT 44.0 06/12/2023   MCV 90.7 06/12/2023   PLT 174 06/12/2023    Lab Results  Component Value Date   CREATININE 0.98 06/12/2023   BUN 17 06/12/2023   NA 137 06/12/2023   K 3.9 06/12/2023   CL  103 06/12/2023   CO2 22 06/12/2023    Lab Results  Component Value Date   ALT 19 06/12/2023   AST 19 06/12/2023   ALKPHOS 68 06/12/2023   BILITOT 1.0 06/12/2023    No results found for: CHOL, HDL, LDLCALC, LDLDIRECT, TRIG, CHOLHDL  No results found for: TSH  No results found for: HGBA1C   ASSESSMENT:   1. Well woman exam   2. Encounter for screening mammogram for malignant neoplasm of breast      PLAN:   Kelly Frank is a 63 y.o. G1P1 female here today for her annual exam, doing well.  Pap: done with cotesting today Mammogram: ordered***due *** Colon: PCP *** ordered colonoscopy***Cologuard -OR- due *** Labs: ***A1C, CMP, HepC, Lipid panel, Vit D, TSH PHQ-2 = ***, discussed coping techniques; RTC if worsens or develops concern Contraception: *** Healthy lifestyle modifications discussed: multivitamin, diet, exercise, sunscreen, tobacco and alcohol use. Emphasized importance of regular physical activity.  Calcium and Vit D recommendation reviewed.  All questions answered to patient's satisfaction.   Follow up 1 yr for annual, sooner prn.    Estil Mangle, DO  OB/GYN at [facility]

## 2024-09-08 ENCOUNTER — Ambulatory Visit: Admitting: Obstetrics

## 2024-09-08 DIAGNOSIS — Z1231 Encounter for screening mammogram for malignant neoplasm of breast: Secondary | ICD-10-CM

## 2024-09-08 DIAGNOSIS — Z01419 Encounter for gynecological examination (general) (routine) without abnormal findings: Secondary | ICD-10-CM

## 2024-09-27 NOTE — Progress Notes (Unsigned)
 ANNUAL PREVENTATIVE CARE GYNECOLOGY  ENCOUNTER NOTE  SUBJECTIVE:       Kelly Frank is a 63 y.o. G1P1 female here for a routine annual gynecologic exam. The patient {is/is not/has never been:13135} sexually active. The patient is not taking hormone replacement therapy. {post-men bleed:13152::Patient denies post-menopausal vaginal bleeding.} Family history of breast, uterine, ovarian cancer: {yes/no:311178}. The patient wears seatbelts: {yes/no:311178}. The patient participates in regular exercise: {yes/no/not asked:9010}. Has the patient ever been transfused or tattooed?: {yes/no/not asked:9010}. The patient reports that there {is/is not:9024} domestic violence in her life. Has the patient completed the Gardasil vaccine? {yes/no:311178}.  Current complaints: 1.  ***    Gynecologic History No LMP recorded. Patient has had a hysterectomy. Contraception: post menopausal status Last Pap: 02/01/2022. Results were: normal History of abnormal pap: *** History of STIs: *** Last Mammogram: 09/19/2023. Results were: normal Last Colonoscopy: 12/14/2013  Last Dexa Scan: never done  PHQ-2:      No data to display          Obstetric History OB History  Gravida Para Term Preterm AB Living  1 1      SAB IAB Ectopic Multiple Live Births          # Outcome Date GA Lbr Len/2nd Weight Sex Type Anes PTL Lv  1 Para             Obstetric Comments  1st Menstrual Cycle:    1st Pregnancy:  17    Past Medical History:  Diagnosis Date   Dilated cardiomyopathy (HCC) 06/23/2017   DOE (dyspnea on exertion)    Essential hypertension 04/29/2017   Hypertension    OSA on CPAP 06/23/2017   Rotator cuff impingement syndrome 12/12/2011   Thyroid disease    Tobacco abuse 04/29/2017    Family History  Problem Relation Age of Onset   Diabetes Mother    Hypertension Mother    Breast cancer Sister 47   Heart attack Neg Hx    Hyperlipidemia Neg Hx    Sudden death Neg Hx     Past Surgical  History:  Procedure Laterality Date   ABDOMINAL HYSTERECTOMY     ESOPHAGOGASTRODUODENOSCOPY (EGD) WITH PROPOFOL  N/A 06/12/2023   Procedure: ESOPHAGOGASTRODUODENOSCOPY (EGD) WITH PROPOFOL ;  Surgeon: Rosalie Kitchens, MD;  Location: [facility];  Service: Gastroenterology;  Laterality: N/A;   SHOULDER SURGERY  2000   left reconstructive    SHOULDER SURGERY Left     Social History   Socioeconomic History   Marital status: Divorced    Spouse name: Not on file   Number of children: Not on file   Years of education: Not on file   Highest education level: Not on file  Occupational History   Not on file  Tobacco Use   Smoking status: Every Day    Current packs/day: 0.50    Average packs/day: 0.5 packs/day for 25.0 years (12.5 ttl pk-yrs)    Types: Cigarettes   Smokeless tobacco: Never  Vaping Use   Vaping status: Some Days  Substance and Sexual Activity   Alcohol use: Yes    Comment: occasionally   Drug use: No   Sexual activity: Yes    Birth control/protection: Surgical  Other Topics Concern   Not on file  Social History Narrative   Not on file   Social Drivers of Health   Financial Resource Strain: Not on file  Food Insecurity: No Food Insecurity (06/11/2023)   Hunger Vital Sign    Worried About Running Out  of Food in the Last Year: Never true    Ran Out of Food in the Last Year: Never true  Transportation Needs: No Transportation Needs (06/11/2023)   PRAPARE - Administrator, Civil Service (Medical): No    Lack of Transportation (Non-Medical): No  Physical Activity: Not on file  Stress: Not on file  Social Connections: Not on file  Intimate Partner Violence: Not At Risk (06/11/2023)   Humiliation, Afraid, Rape, and Kick questionnaire    Fear of Current or Ex-Partner: No    Emotionally Abused: No    Physically Abused: No    Sexually Abused: No    Current Outpatient Medications on File Prior to Visit  Medication Sig Dispense Refill   albuterol (VENTOLIN HFA)  108 (90 Base) MCG/ACT inhaler Inhale 2 puffs into the lungs as needed for wheezing or shortness of breath.     aspirin  EC 81 MG tablet Take 81 mg by mouth daily.     carvedilol  (COREG ) 25 MG tablet Take 25 mg by mouth 2 (two) times daily with a meal.     ENTRESTO  97-103 MG Take 1 tablet by mouth 2 (two) times daily.     furosemide  (LASIX ) 20 MG tablet Take 20 mg by mouth as needed for fluid or edema.  0   levothyroxine  (SYNTHROID ) 175 MCG tablet Take 175 mcg by mouth daily.     pantoprazole  (PROTONIX ) 40 MG tablet Take 1 tablet (40 mg total) by mouth 2 (two) times daily. 60 tablet 3   No current facility-administered medications on file prior to visit.    No Known Allergies   Review of Systems ROS Review of Systems - General ROS: negative for - chills, fatigue, fever, hot flashes, night sweats, weight gain or weight loss Psychological ROS: negative for - anxiety, decreased libido, depression, mood swings, physical abuse or sexual abuse Ophthalmic ROS: negative for - blurry vision, eye pain or loss of vision ENT ROS: negative for - headaches, hearing change, visual changes or vocal changes Allergy and Immunology ROS: negative for - hives, itchy/watery eyes or seasonal allergies Hematological and Lymphatic ROS: negative for - bleeding problems, bruising, swollen lymph nodes or weight loss Endocrine ROS: negative for - galactorrhea, hair pattern changes, hot flashes, malaise/lethargy, mood swings, palpitations, polydipsia/polyuria, skin changes, temperature intolerance or unexpected weight changes Breast ROS: negative for - new or changing breast lumps or nipple discharge Respiratory ROS: negative for - cough or shortness of breath Cardiovascular ROS: negative for - chest pain, irregular heartbeat, palpitations or shortness of breath Gastrointestinal ROS: no abdominal pain, change in bowel habits, or black or bloody stools Genito-Urinary ROS: no dysuria, trouble voiding, or  hematuria Musculoskeletal ROS: negative for - joint pain or joint stiffness Neurological ROS: negative for - bowel and bladder control changes Dermatological ROS: negative for rash and skin lesion changes   OBJECTIVE:   There were no vitals taken for this visit.  CONSTITUTIONAL: Well-developed, well-nourished female in no acute distress.  PSYCHIATRIC: Normal mood and affect. Normal behavior. Normal judgment and thought content. NEUROLGIC: Alert and oriented to person, place, and time. Normal muscle tone coordination. No cranial nerve deficit noted. HENT:  Normocephalic, atraumatic, External right and left ear normal. Oropharynx is clear and moist EYES: Conjunctivae and EOM are normal. No scleral icterus.  NECK: Normal range of motion, supple, no masses.  Normal thyroid.  SKIN: Skin is warm and dry. No rash noted. Not diaphoretic. No erythema. No pallor. CARDIOVASCULAR: Normal heart rate noted, regular  rhythm, no murmur. RESPIRATORY: Clear to auscultation bilaterally. Effort and breath sounds normal, no problems with respiration noted. BREASTS: Symmetric in size. No masses, skin changes, nipple drainage, or lymphadenopathy. ABDOMEN: Soft, normal bowel sounds, no distention noted.  No tenderness, rebound or guarding.  PELVIC:  Bladder {:311640}  Urethra: {:311719}  Vulva: {:311722}  Vagina: {:311643}  Cervix: {:311644}  Uterus: {:311718}  Adnexa: {:311645}  RV: {Blank multiple:19196::External Exam NormaI,No Rectal Masses,Normal Sphincter tone}  MUSCULOSKELETAL: Normal range of motion. No tenderness.  No cyanosis, clubbing, or edema.  2+ distal pulses. LYMPHATIC: No Axillary, Supraclavicular, or Inguinal Adenopathy.  Labs: Lab Results  Component Value Date   WBC 5.6 06/12/2023   HGB 14.3 06/12/2023   HCT 44.0 06/12/2023   MCV 90.7 06/12/2023   PLT 174 06/12/2023    Lab Results  Component Value Date   CREATININE 0.98 06/12/2023   BUN 17 06/12/2023   NA 137 06/12/2023    K 3.9 06/12/2023   CL 103 06/12/2023   CO2 22 06/12/2023    Lab Results  Component Value Date   ALT 19 06/12/2023   AST 19 06/12/2023   ALKPHOS 68 06/12/2023   BILITOT 1.0 06/12/2023    No results found for: CHOL, HDL, LDLCALC, LDLDIRECT, TRIG, CHOLHDL  No results found for: TSH  No results found for: HGBA1C   ASSESSMENT:   1. Well woman exam      PLAN:   Madelaine A Unangst is a 63 y.o. G1P1 female here today for her annual exam, doing well.  Pap: done with cotesting today Mammogram: ordered***due *** Colon: PCP *** ordered colonoscopy***Cologuard -OR- due *** Labs: ***A1C, CMP, HepC, Lipid panel, Vit D, TSH PHQ-2 = ***, discussed coping techniques; RTC if worsens or develops concern Contraception: *** Healthy lifestyle modifications discussed: multivitamin, diet, exercise, sunscreen, tobacco and alcohol use. Emphasized importance of regular physical activity.  Calcium and Vit D recommendation reviewed.  All questions answered to patient's satisfaction.   Follow up 1 yr for annual, sooner prn.    Estil Mangle, DO [facility]

## 2024-09-27 NOTE — Patient Instructions (Signed)
 Preventive Care 58-63 Years Old, Female  Preventive care refers to lifestyle choices and visits with your health care provider that can promote health and wellness. Preventive care visits are also called wellness exams.  What can I expect for my preventive care visit?  Counseling  Your health care provider may ask you questions about your:  Medical history, including:  Past medical problems.  Family medical history.  Pregnancy history.  Current health, including:  Menstrual cycle.  Method of birth control.  Emotional well-being.  Home life and relationship well-being.  Sexual activity and sexual health.  Lifestyle, including:  Alcohol, nicotine or tobacco, and drug use.  Access to firearms.  Diet, exercise, and sleep habits.  Work and work Astronomer.  Sunscreen use.  Safety issues such as seatbelt and bike helmet use.  Physical exam  Your health care provider will check your:  Height and weight. These may be used to calculate your BMI (body mass index). BMI is a measurement that tells if you are at a healthy weight.  Waist circumference. This measures the distance around your waistline. This measurement also tells if you are at a healthy weight and may help predict your risk of certain diseases, such as type 2 diabetes and high blood pressure.  Heart rate and blood pressure.  Body temperature.  Skin for abnormal spots.  What immunizations do I need?    Vaccines are usually given at various ages, according to a schedule. Your health care provider will recommend vaccines for you based on your age, medical history, and lifestyle or other factors, such as travel or where you work.  What tests do I need?  Screening  Your health care provider may recommend screening tests for certain conditions. This may include:  Lipid and cholesterol levels.  Diabetes screening. This is done by checking your blood sugar (glucose) after you have not eaten for a while (fasting).  Pelvic exam and Pap test.  Hepatitis B test.  Hepatitis C  test.  HIV (human immunodeficiency virus) test.  STI (sexually transmitted infection) testing, if you are at risk.  Lung cancer screening.  Colorectal cancer screening.  Mammogram. Talk with your health care provider about when you should start having regular mammograms. This may depend on whether you have a family history of breast cancer.  BRCA-related cancer screening. This may be done if you have a family history of breast, ovarian, tubal, or peritoneal cancers.  Bone density scan. This is done to screen for osteoporosis.  Talk with your health care provider about your test results, treatment options, and if necessary, the need for more tests.  Follow these instructions at home:  Eating and drinking    Eat a diet that includes fresh fruits and vegetables, whole grains, lean protein, and low-fat dairy products.  Take vitamin and mineral supplements as recommended by your health care provider.  Do not drink alcohol if:  Your health care provider tells you not to drink.  You are pregnant, may be pregnant, or are planning to become pregnant.  If you drink alcohol:  Limit how much you have to 0-1 drink a day.  Know how much alcohol is in your drink. In the [place] one drink equals one 12 oz bottle of beer (355 mL), one 5 oz glass of wine (148 mL), or one 1 oz glass of hard liquor (44 mL).  Lifestyle  Brush your teeth every morning and night with fluoride toothpaste. Floss one time each day.  Exercise for at least  30 minutes 5 or more days each week.  Do not use any products that contain nicotine or tobacco. These products include cigarettes, chewing tobacco, and vaping devices, such as e-cigarettes. If you need help quitting, ask your health care provider.  Do not use drugs.  If you are sexually active, practice safe sex. Use a condom or other form of protection to prevent STIs.  If you do not wish to become pregnant, use a form of birth control. If you plan to become pregnant, see your health care provider for a  prepregnancy visit.  Take aspirin only as told by your health care provider. Make sure that you understand how much to take and what form to take. Work with your health care provider to find out whether it is safe and beneficial for you to take aspirin daily.  Find healthy ways to manage stress, such as:  Meditation, yoga, or listening to music.  Journaling.  Talking to a trusted person.  Spending time with friends and family.  Minimize exposure to UV radiation to reduce your risk of skin cancer.  Safety  Always wear your seat belt while driving or riding in a vehicle.  Do not drive:  If you have been drinking alcohol. Do not ride with someone who has been drinking.  When you are tired or distracted.  While texting.  If you have been using any mind-altering substances or drugs.  Wear a helmet and other protective equipment during sports activities.  If you have firearms in your house, make sure you follow all gun safety procedures.  Seek help if you have been physically or sexually abused.  What's next?  Visit your health care provider once a year for an annual wellness visit.  Ask your health care provider how often you should have your eyes and teeth checked.  Stay up to date on all vaccines.  This information is not intended to replace advice given to you by your health care provider. Make sure you discuss any questions you have with your health care provider.  Document Revised: 06/13/2021 Document Reviewed: 06/13/2021  Elsevier Patient Education  2024 [employer].

## 2024-09-29 ENCOUNTER — Other Ambulatory Visit: Payer: Self-pay | Admitting: Obstetrics

## 2024-09-29 ENCOUNTER — Ambulatory Visit
Admission: RE | Admit: 2024-09-29 | Discharge: 2024-09-29 | Disposition: A | Discharge status: Home / Self Care | Source: Ambulatory Visit | Attending: Obstetrics | Admitting: Obstetrics

## 2024-09-29 ENCOUNTER — Encounter: Payer: Self-pay | Admitting: Obstetrics

## 2024-09-29 ENCOUNTER — Ambulatory Visit (INDEPENDENT_AMBULATORY_CARE_PROVIDER_SITE_OTHER): Admitting: Obstetrics

## 2024-09-29 VITALS — BP 158/100 | HR 74 | Ht 67.0 in | Wt 217.0 lb

## 2024-09-29 DIAGNOSIS — Z1231 Encounter for screening mammogram for malignant neoplasm of breast: Secondary | ICD-10-CM | POA: Insufficient documentation

## 2024-09-29 DIAGNOSIS — Z720 Tobacco use: Secondary | ICD-10-CM

## 2024-09-29 DIAGNOSIS — Z01419 Encounter for gynecological examination (general) (routine) without abnormal findings: Secondary | ICD-10-CM

## 2024-09-29 DIAGNOSIS — F17210 Nicotine dependence, cigarettes, uncomplicated: Secondary | ICD-10-CM

## 2024-10-28 ENCOUNTER — Ambulatory Visit: Admitting: Podiatry

## 2024-10-28 DIAGNOSIS — L989 Disorder of the skin and subcutaneous tissue, unspecified: Secondary | ICD-10-CM

## 2024-10-28 DIAGNOSIS — Z79899 Other long term (current) drug therapy: Secondary | ICD-10-CM

## 2024-10-28 DIAGNOSIS — B351 Tinea unguium: Secondary | ICD-10-CM

## 2024-10-28 NOTE — Progress Notes (Signed)
 Subjective:  Patient ID: Kelly Frank, female    DOB: Dec 09, 1961,  MRN: [id number]  Chief Complaint  Patient presents with   Callouses    63 y.o. female presents with the above complaint.  Patient presents with left submetatarsal 4 x 9 skin lesion painful to touch is progressing and worse worse with ambulation and shoe pressure she has not seen MRIs prior to seeing me denies any other acute complaints she would like to discuss treatment options for this.  She also has secondary complaint of left hallux 3rd and 2nd digit onychomycosis thickened onychodystrophy mycotic toenails x 3.  She wishes to discuss surgical options for this pain scale is 7 out of 10 hurts with ambulation worse with pressure   Review of Systems: Negative except as noted in the HPI. Denies N/V/F/Ch.  Past Medical History:  Diagnosis Date   Dilated cardiomyopathy (HCC) 06/23/2017   DOE (dyspnea on exertion)    Essential hypertension 04/29/2017   Hypertension    OSA on CPAP 06/23/2017   Rotator cuff impingement syndrome 12/12/2011   Thyroid disease    Tobacco abuse 04/29/2017    Current Outpatient Medications:    terbinafine (LAMISIL) 250 MG tablet, Take 1 tablet (250 mg total) by mouth daily., Disp: 90 tablet, Rfl: 0   albuterol (VENTOLIN HFA) 108 (90 Base) MCG/ACT inhaler, Inhale 2 puffs into the lungs as needed for wheezing or shortness of breath., Disp: , Rfl:    aspirin  EC 81 MG tablet, Take 81 mg by mouth daily., Disp: , Rfl:    carvedilol  (COREG ) 25 MG tablet, Take 25 mg by mouth 2 (two) times daily with a meal., Disp: , Rfl:    ENTRESTO  97-103 MG, Take 1 tablet by mouth 2 (two) times daily., Disp: , Rfl:    furosemide  (LASIX ) 20 MG tablet, Take 20 mg by mouth as needed for fluid or edema., Disp: , Rfl: 0   levothyroxine  (SYNTHROID ) 175 MCG tablet, Take 175 mcg by mouth daily., Disp: , Rfl:    pantoprazole  (PROTONIX ) 40 MG tablet, Take 1 tablet (40 mg total) by mouth 2 (two) times daily. (Patient not taking:  Reported on 09/29/2024), Disp: 60 tablet, Rfl: 3  Social History   Tobacco Use  Smoking Status Every Day   Current packs/day: 0.50   Average packs/day: 0.5 packs/day for 25.0 years (12.5 ttl pk-yrs)   Types: Cigarettes  Smokeless Tobacco Never    No Known Allergies Objective:  There were no vitals filed for this visit. There is no height or weight on file to calculate BMI. Constitutional Well developed. Well nourished.  Vascular Dorsalis pedis pulses palpable bilaterally. Posterior tibial pulses palpable bilaterally. Capillary refill normal to all digits.  No cyanosis or clubbing noted. Pedal hair growth normal.  Neurologic Normal speech. Oriented to person, place, and time. Epicritic sensation to light touch grossly present bilaterally.  Dermatologic Nails left hallux 2nd and 3rd digit onychomycosis thickened elongated dystrophic mycotic Skin left submetatarsal 4 porokeratotic lesion  Orthopedic: Normal joint ROM without pain or crepitus bilaterally. No visible deformities. No bony tenderness.   Radiographs: None Assessment:   1. Sweeny-term use of high-risk medication   2. Onychomycosis due to dermatophyte   3. Nail fungus   4. Benign skin lesion    Plan:  Patient was evaluated and treated and all questions answered.  Left submet 4 benign skin lesion --Lesion was debrided today without complications. Hemostasis was achieved and the area was cleaned. Cantharone was applied followed by an occlusive  bandage. Post procedure complications were discussed. Monitor for signs or symptoms of infection and directed to call the office mainly should any occur.  Left hallux 2nd and 3rd digit onychomycosis  -Educated the patient on the etiology of onychomycosis and various treatment options associated with improving the fungal load.  I explained to the patient that there is 3 treatment options available to treat the onychomycosis including topical, p.o., laser treatment.  Patient  elected to undergo p.o. options with Lamisil/terbinafine therapy.  In order for me to start the medication therapy, I explained to the patient the importance of evaluating the liver and obtaining the liver function test.  Once the liver function test comes back normal I will start him on 30-month course of Lamisil therapy.  Patient understood all risk and would like to proceed with Lamisil therapy.  I have asked the patient to immediately stop the Lamisil therapy if she has any reactions to it and call the office or go to the emergency room right away.  Patient states understanding

## 2024-10-29 LAB — HEPATIC FUNCTION PANEL
ALT: 12 IU/L (ref 0–32)
AST: 18 IU/L (ref 0–40)
Albumin: 4.3 g/dL (ref 3.9–4.9)
Alkaline Phosphatase: 73 IU/L (ref 49–135)
Bilirubin Total: 0.5 mg/dL (ref 0.0–1.2)
Bilirubin, Direct: 0.14 mg/dL (ref 0.00–0.40)
Total Protein: 6.6 g/dL (ref 6.0–8.5)

## 2024-10-29 MED ORDER — TERBINAFINE HCL 250 MG PO TABS: 250.0000 mg | ORAL_TABLET | Freq: Every day | ORAL | 0 refills | Status: AC

## 2025-01-27 ENCOUNTER — Other Ambulatory Visit: Payer: Self-pay | Admitting: Podiatry

## 2025-03-01 ENCOUNTER — Ambulatory Visit: Admitting: Podiatry
